# Patient Record
Sex: Female | Born: 1965 | Race: White | Hispanic: No | Marital: Married | Smoking: Current every day smoker
Health system: Southern US, Community
[De-identification: ages and names within clinical notes are randomized; demographics above are authoritative.]

## PROBLEM LIST (undated history)

## (undated) DIAGNOSIS — K219 Gastro-esophageal reflux disease without esophagitis: Secondary | ICD-10-CM

## (undated) DIAGNOSIS — F419 Anxiety disorder, unspecified: Secondary | ICD-10-CM

## (undated) DIAGNOSIS — M1990 Unspecified osteoarthritis, unspecified site: Secondary | ICD-10-CM

## (undated) DIAGNOSIS — I10 Essential (primary) hypertension: Secondary | ICD-10-CM

## (undated) HISTORY — PX: COLONOSCOPY: SHX174

---

## 2012-04-04 ENCOUNTER — Encounter (HOSPITAL_COMMUNITY): Payer: Self-pay | Admitting: Adult Health

## 2012-04-04 ENCOUNTER — Emergency Department (HOSPITAL_COMMUNITY)
Admission: EM | Admit: 2012-04-04 | Discharge: 2012-04-04 | Disposition: A | Discharge status: Home / Self Care | Payer: Managed Care, Other (non HMO) | Attending: Emergency Medicine | Admitting: Emergency Medicine

## 2012-04-04 ENCOUNTER — Emergency Department (HOSPITAL_COMMUNITY): Payer: Managed Care, Other (non HMO)

## 2012-04-04 DIAGNOSIS — R110 Nausea: Secondary | ICD-10-CM | POA: Insufficient documentation

## 2012-04-04 DIAGNOSIS — R12 Heartburn: Secondary | ICD-10-CM | POA: Insufficient documentation

## 2012-04-04 DIAGNOSIS — Z79899 Other long term (current) drug therapy: Secondary | ICD-10-CM | POA: Insufficient documentation

## 2012-04-04 DIAGNOSIS — R61 Generalized hyperhidrosis: Secondary | ICD-10-CM | POA: Insufficient documentation

## 2012-04-04 DIAGNOSIS — R1013 Epigastric pain: Secondary | ICD-10-CM | POA: Insufficient documentation

## 2012-04-04 DIAGNOSIS — K2900 Acute gastritis without bleeding: Secondary | ICD-10-CM | POA: Insufficient documentation

## 2012-04-04 DIAGNOSIS — F17200 Nicotine dependence, unspecified, uncomplicated: Secondary | ICD-10-CM | POA: Insufficient documentation

## 2012-04-04 DIAGNOSIS — K2970 Gastritis, unspecified, without bleeding: Secondary | ICD-10-CM

## 2012-04-04 DIAGNOSIS — I10 Essential (primary) hypertension: Secondary | ICD-10-CM | POA: Insufficient documentation

## 2012-04-04 HISTORY — DX: Essential (primary) hypertension: I10

## 2012-04-04 LAB — CBC
Hemoglobin: 16.1 g/dL — ABNORMAL HIGH (ref 12.0–15.0)
MCV: 91 fL (ref 78.0–100.0)
Platelets: 249 10*3/uL (ref 150–400)
RBC: 5.23 MIL/uL — ABNORMAL HIGH (ref 3.87–5.11)
WBC: 18.3 10*3/uL — ABNORMAL HIGH (ref 4.0–10.5)

## 2012-04-04 LAB — HEPATIC FUNCTION PANEL: Bilirubin, Direct: <0.1 mg/dL (ref 0.0–0.3)

## 2012-04-04 LAB — BASIC METABOLIC PANEL
CO2: 23 mEq/L (ref 19–32)
Chloride: 101 mEq/L (ref 96–112)
Creatinine, Ser: 0.79 mg/dL (ref 0.50–1.10)
Sodium: 137 mEq/L (ref 135–145)

## 2012-04-04 LAB — LIPASE, BLOOD: Lipase: 49 U/L (ref 11–59)

## 2012-04-04 LAB — D-DIMER, QUANTITATIVE: D-Dimer, Quant: <0.27 ug/mL-FEU (ref 0.00–0.48)

## 2012-04-04 LAB — POCT I-STAT TROPONIN I: Troponin i, poc: 0 ng/mL (ref 0.00–0.08)

## 2012-04-04 LAB — TROPONIN I: Troponin I: <0.3 ng/mL (ref <0.30)

## 2012-04-04 MED ORDER — OMEPRAZOLE 20 MG PO CPDR: 20.0000 mg | DELAYED_RELEASE_CAPSULE | Freq: Every day | ORAL | Status: DC

## 2012-04-04 MED ORDER — FAMOTIDINE 20 MG PO TABS
20.0000 mg | ORAL_TABLET | Freq: Once | ORAL | Status: AC
  Administered 2012-04-04: 20 mg via ORAL
  Filled 2012-04-04: qty 1

## 2012-04-04 MED ORDER — ONDANSETRON 4 MG PO TBDP
8.0000 mg | ORAL_TABLET | Freq: Once | ORAL | Status: AC
  Administered 2012-04-04: 8 mg via ORAL
  Filled 2012-04-04: qty 2

## 2012-04-04 MED ORDER — MORPHINE SULFATE 4 MG/ML IJ SOLN
4.0000 mg | Freq: Once | INTRAMUSCULAR | Status: AC
  Administered 2012-04-04: 4 mg via INTRAMUSCULAR
  Filled 2012-04-04: qty 1

## 2012-04-04 MED ORDER — SUCRALFATE 1 G PO TABS: 1.0000 g | ORAL_TABLET | Freq: Four times a day (QID) | ORAL | Status: DC

## 2012-04-04 MED ORDER — GI COCKTAIL ~~LOC~~
30.0000 mL | Freq: Once | ORAL | Status: AC
  Administered 2012-04-04: 30 mL via ORAL
  Filled 2012-04-04: qty 30

## 2012-04-04 MED ORDER — PANTOPRAZOLE SODIUM 40 MG IV SOLR
40.0000 mg | Freq: Once | INTRAVENOUS | Status: DC
  Filled 2012-04-04: qty 40

## 2012-04-04 NOTE — ED Notes (Addendum)
Onset of Epigastric/sternal chest pain described as sharp that began 04/04/11 in the AM, pain radiatted down into abdomen and into back. Pain is associated with nausea, "heartburn" and diaphoresis. Denies SOB, dizziness and vomiting. Pain is intermittent in  Midback. The epigastric pain is constant.

## 2012-04-04 NOTE — ED Notes (Signed)
Pt transported to US

## 2012-04-04 NOTE — ED Provider Notes (Signed)
History  This chart was scribed for Glynn Octave, MD by Bennett Scrape, ED Scribe. This patient was seen in room A06C/A06C and the patient's care was started at 2:24 AM.  CSN: [id number]  Arrival date & time 04/04/12  0130   First MD Initiated Contact with Patient 04/04/12 0224      Chief Complaint  Patient presents with  . Chest Pain     The history is provided by the patient. No language interpreter was used.    Chloe Warren is a 47 y.o. female who presents to the Emergency Department complaining of approximately 16 hours of gradual onset, non-changing, constant sternal CP described as sharp that radiates into the epigastric region and occasionally into the lower back with associated nausea and diaphoresis. She reports having similar intermittent pain that resolved on its own but nothing constant. She denies having any modifying factors and reports taking an Aleve with improvement.She reports having a prior GERD diagnosis with symptoms that would improve with milk but denies similarities. She denies having a h/o cardiac or abdominal problems. She denies having prior stress tests performed. She denies rash, palpitations, diarrhea, fevers, chills and dizziness as associated symptoms. She has a h/o HTN and is a current everyday smoker and occasional alcohol user.   PCP is with [facility]  Past Medical History  Diagnosis Date  . Hypertension     History reviewed. No pertinent past surgical history.  No family history on file.  History  Substance Use Topics  . Smoking status: Current Every Day Smoker  . Smokeless tobacco: Not on file  . Alcohol Use: Yes    No OB history provided.  Review of Systems  A complete 10 system review of systems was obtained and all systems are negative except as noted in the HPI and PMH.   Allergies  Review of patient's allergies indicates no known allergies.  Home Medications   Current Outpatient Rx  Name  Route  Sig  Dispense   Refill  . METOPROLOL TARTRATE 50 MG PO TABS   Oral   Take 50 mg by mouth every evening.         . ADULT MULTIVITAMIN W/MINERALS CH   Oral   Take 1 tablet by mouth daily.         Marland Kitchen NAPROXEN SODIUM 220 MG PO TABS   Oral   Take 220 mg by mouth 2 (two) times daily as needed. For pain           Triage Vitals: BP 160/97  Pulse 71  Temp 98.1 F (36.7 C) (Oral)  Resp 16  SpO2 96%  Physical Exam  Nursing note and vitals reviewed. Constitutional: She is oriented to person, place, and time. She appears well-developed and well-nourished. No distress.  HENT:  Head: Normocephalic and atraumatic.  Mouth/Throat: Oropharynx is clear and moist.  Eyes: Conjunctivae normal and EOM are normal. Pupils are equal, round, and reactive to light.  Neck: Normal range of motion. Neck supple. No tracheal deviation present.  Cardiovascular: Normal rate and regular rhythm.   Pulmonary/Chest: Effort normal and breath sounds normal. No respiratory distress. She exhibits no tenderness (chest pain is non-reproducible to palpation).  Abdominal: Soft. Bowel sounds are normal. There is tenderness (mild epigastric tenderness to palpation). There is no rebound and no guarding.  Musculoskeletal: Normal range of motion. She exhibits no edema.  Neurological: She is alert and oriented to person, place, and time. No cranial nerve deficit.       Strength  is 5/5 throughout  Skin: Skin is warm and dry.  Psychiatric: She has a normal mood and affect. Her behavior is normal.    ED Course  Procedures (including critical care time)  DIAGNOSTIC STUDIES: Oxygen Saturation is 96% on room air, adeqaute by my interpretation.    COORDINATION OF CARE: 2:48 AM- Informed pt of negative heart tracings. Discussed treatment plan which includes CXR, GI cocktail, d-dimer and CBC panel with pt at bedside and pt agreed to plan.   3:00 AM- Ordered GI cocktail and 40 mg Protonix injection  3:30 AM- Ordered Pepcid 20 mg  tablet  3:36 AM-Pt rechecked and still complains of pain. Will recheck after pt is given Pepcid  5:10 AM- Pt rechecked and reports abdominal pain only. Non-tender to palpation upon re-exam. Informed pt of lab and radiology results. Discussed discharge plan of Pepcid, antiemetic and follow up with PCP with pt and pt agreed. Also advised pt to take tylenol for pain due to Aleve and ibuprofen possibly leading to ulcers and pt agreed. Pt is also requesting a work note which I will provide.  5:15 AM-Ordered 8 mg Zofran tablet  Labs Reviewed  CBC - Abnormal; Notable for the following:    WBC 18.3 (*)     RBC 5.23 (*)     Hemoglobin 16.1 (*)     HCT 47.6 (*)     All other components within normal limits  BASIC METABOLIC PANEL - Abnormal; Notable for the following:    Glucose, Bld 109 (*)     All other components within normal limits  LIPASE, BLOOD  POCT I-STAT TROPONIN I  D-DIMER, QUANTITATIVE   Dg Chest 2 View  04/04/2012  *RADIOLOGY REPORT*  Clinical Data: Mid chest pain.  CHEST - 2 VIEW  Comparison: None.  Findings: Lungs are clear.  Heart size is normal.  No pneumothorax or pleural fluid.  IMPRESSION: No acute disease.   Original Report Authenticated By: Holley Dexter, M.D.      No diagnosis found.    MDM  Constant substernal chest pain since 11 AM yesterday that radiates to the epigastrium. Associated with nausea. No shortness of breath, vomiting, diaphoresis cough or fever. No cardiac history.    EKG nonischemic. Chest x-ray negative. Leukocytosis noted. LFTs and lipase normal.  D-dimer negative. Delta troponin negative. Patient's pain is atypical for ACS as it has been ongoing for 16 hours. Some relief with GI cocktail. We'll treat with PPI and PCP followup.  [place] of RUQ pending at time of sign out to Dr. Effie Shy.   Date: 04/04/2012  Rate: 75  Rhythm: normal sinus rhythm  QRS Axis: normal  Intervals: normal  ST/T Wave abnormalities: normal  Conduction Disutrbances:none   Narrative Interpretation:   Old EKG Reviewed: none available     I personally performed the services described in this documentation, which was scribed in my presence. The recorded information has been reviewed and is accurate.    Glynn Octave, MD 04/04/12 [id number]

## 2013-09-18 IMAGING — CR DG CHEST 2V
2 series · 2 of 2 positions shown · non-contrast
Comparison: None.

CLINICAL DATA: Mid chest pain.

CHEST - 2 VIEW

[w chest pa]
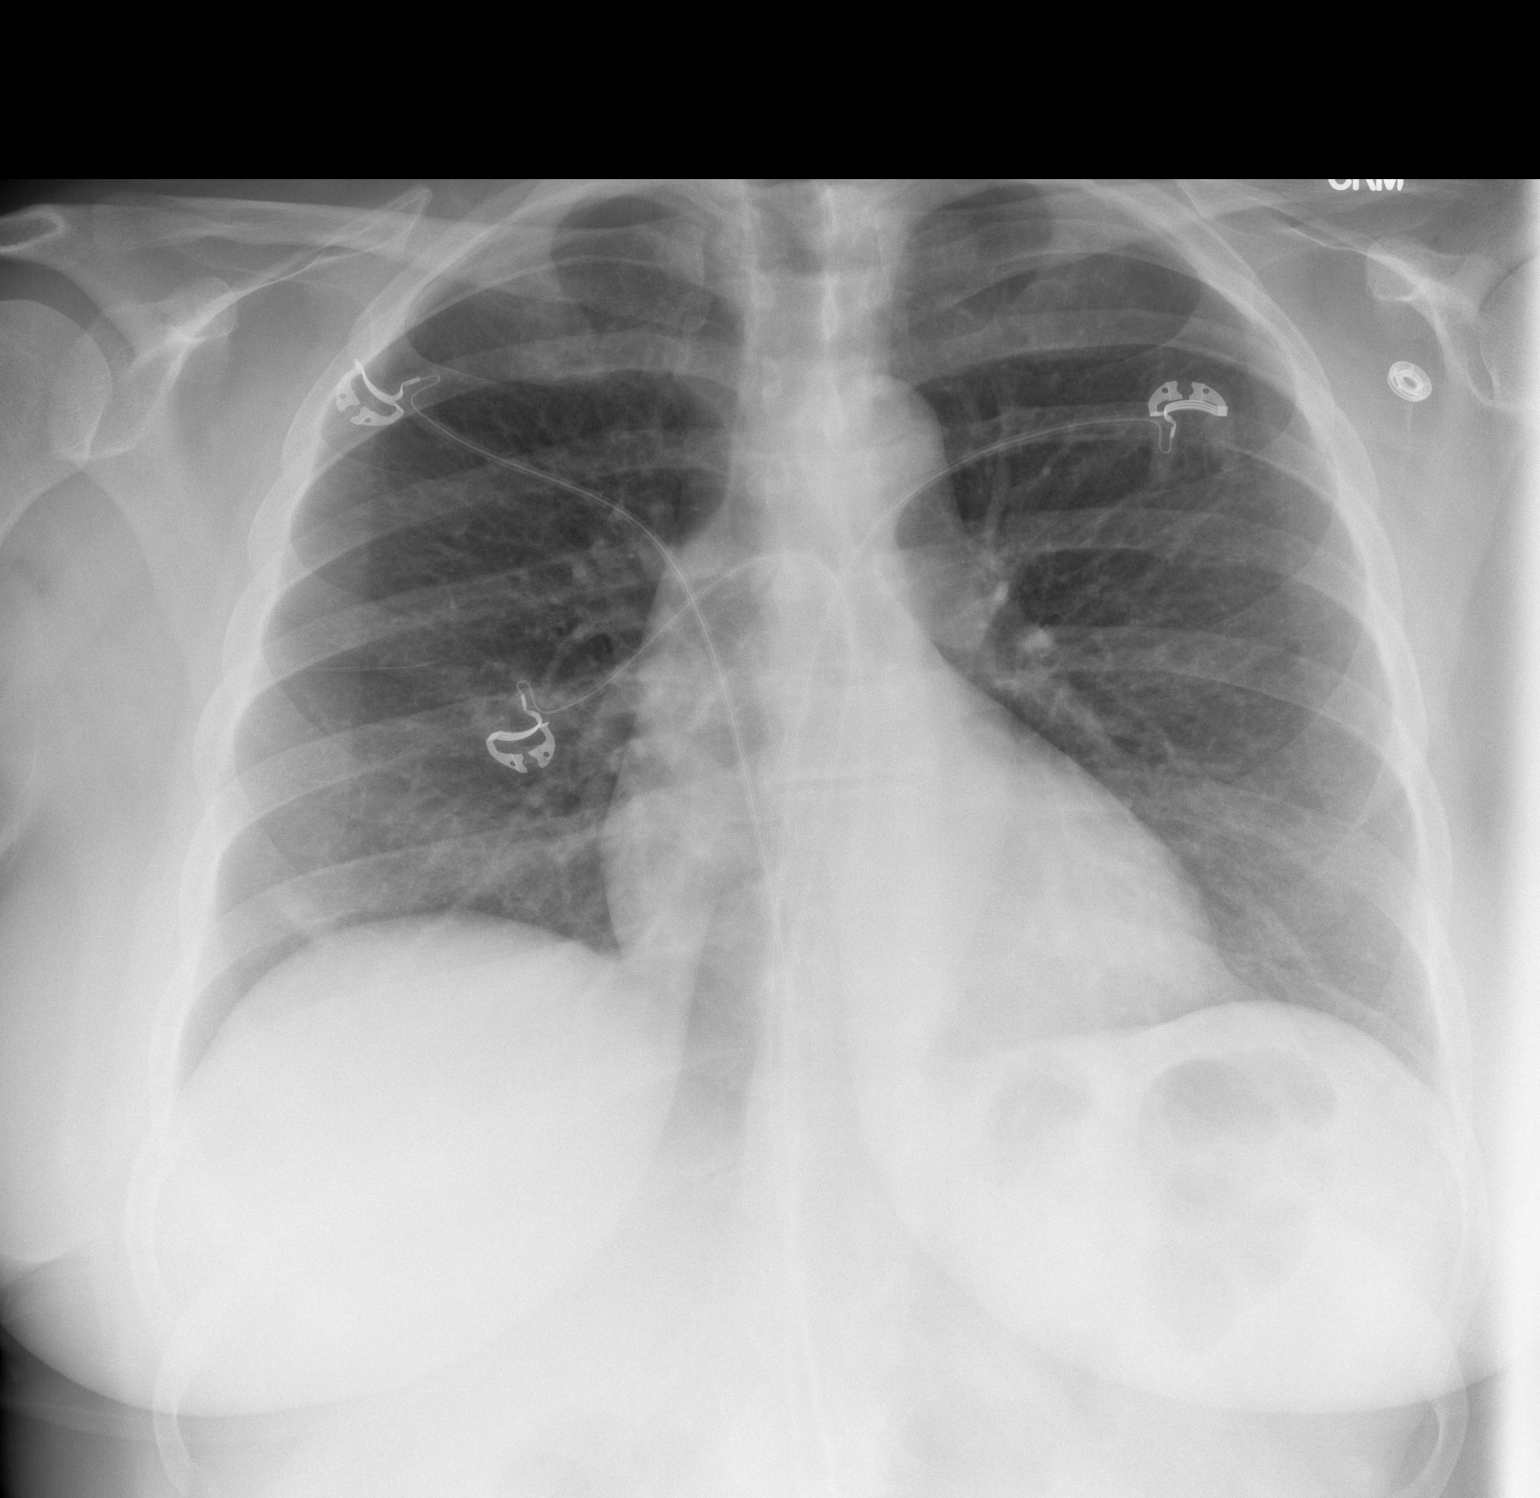

[w chest lat]
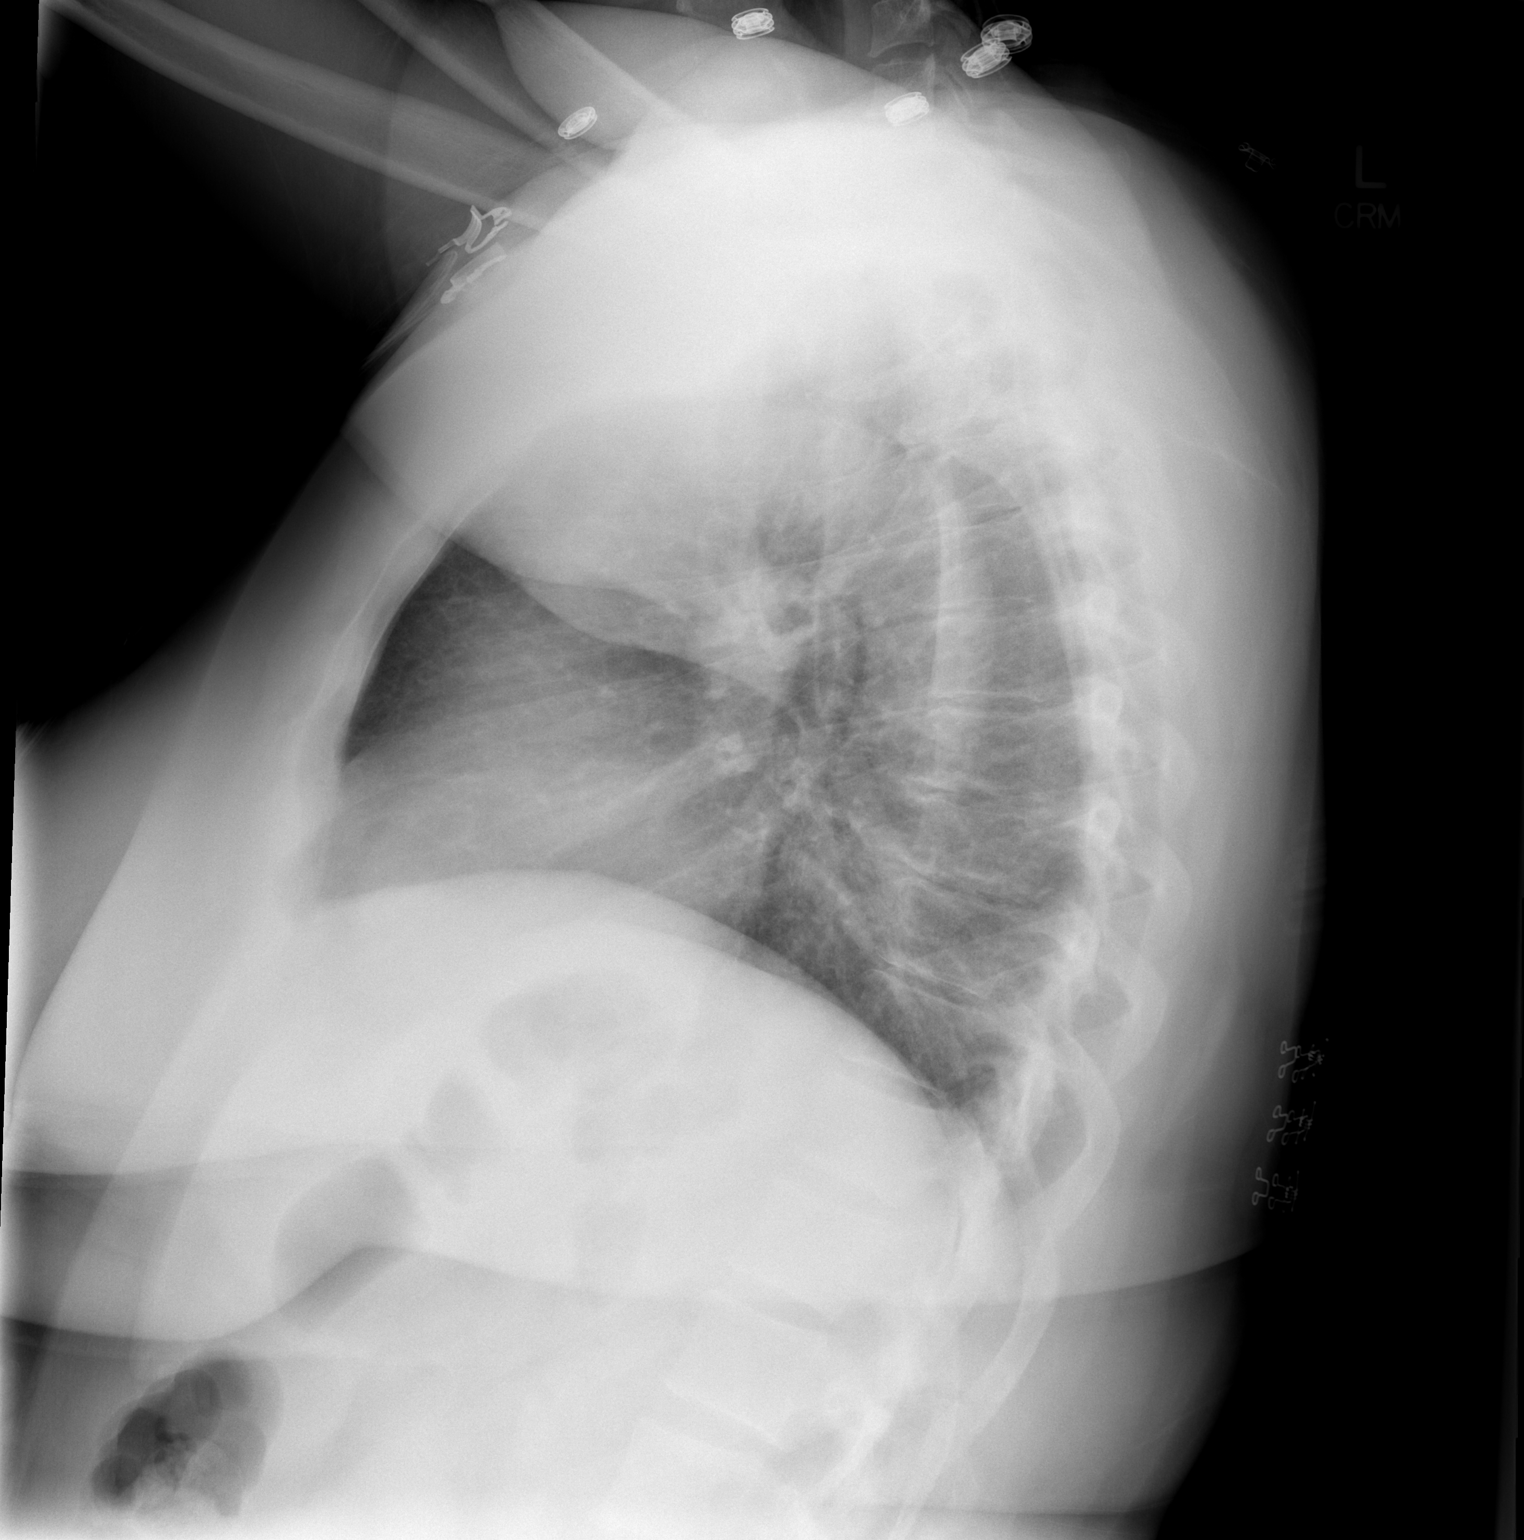

[2 of 2 positions shown; findings below may reference images not displayed]

FINDINGS: Lungs are clear.  Heart size is normal.  No pneumothorax
or pleural fluid.
IMPRESSION: No acute disease.

## 2013-09-18 IMAGING — US US ABDOMEN COMPLETE
1 series · 14 of 25 positions shown · non-contrast
Comparison: None.

CLINICAL DATA: Right upper quadrant pain.  Leukocytosis.

COMPLETE ABDOMINAL ULTRASOUND

[Series 1: us abdomen complete · 0.31mm/px · 14 of 78 slices shown]
[im 1/78]
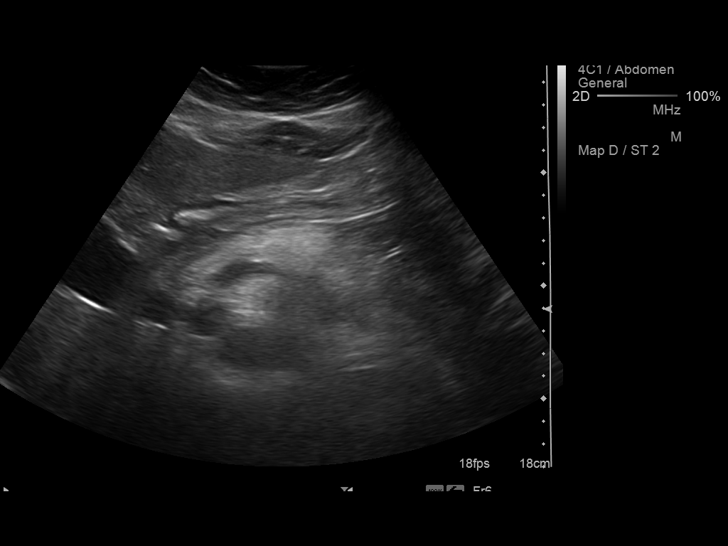
[im 7/78]
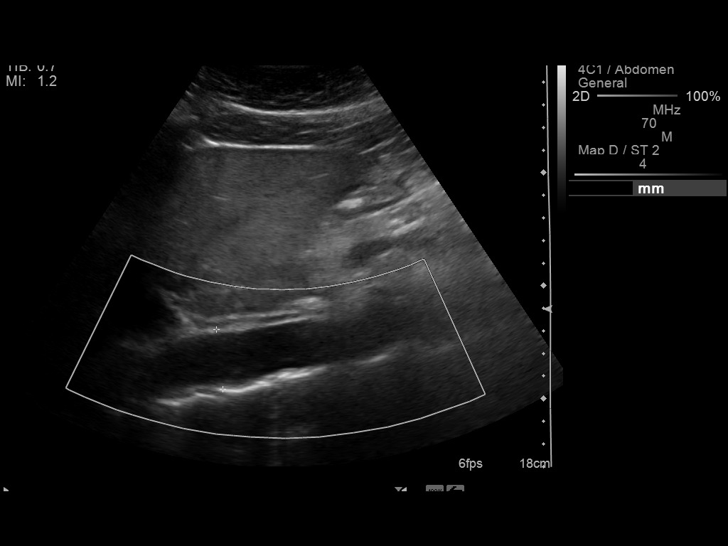
[im 13/78]
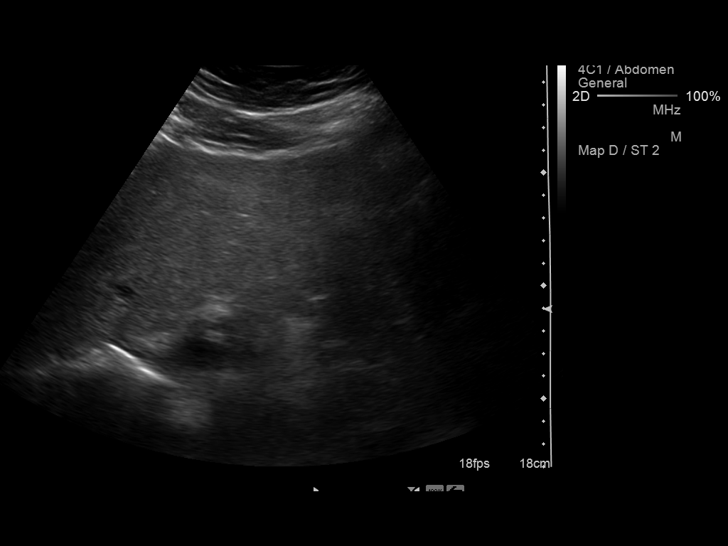
[im 20/78]
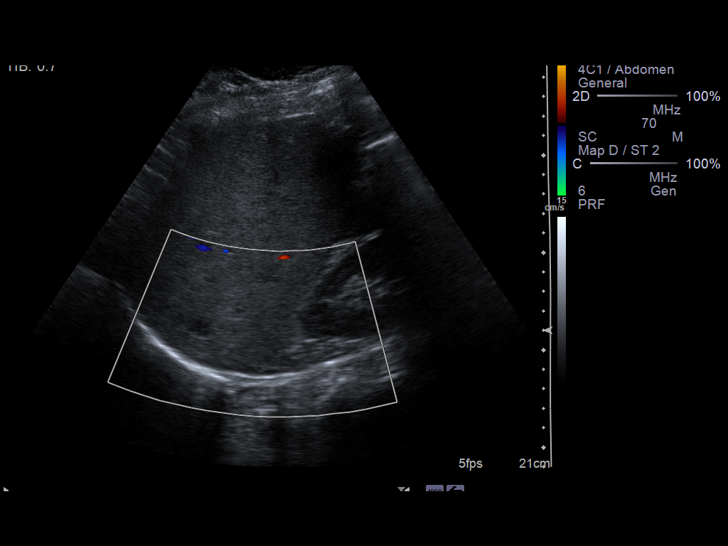
[im 26/78]
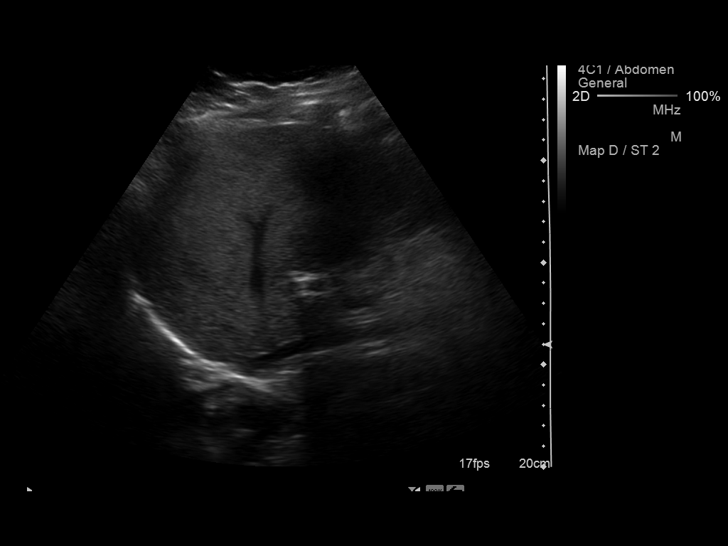
[im 29/78]
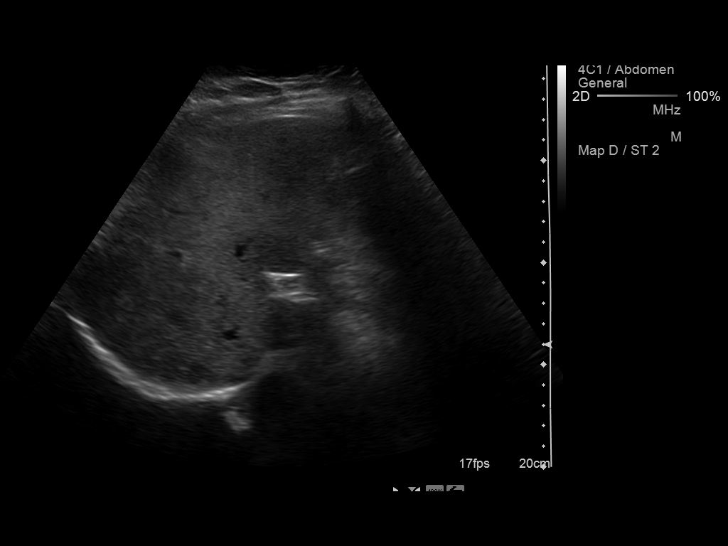
[im 36/78]
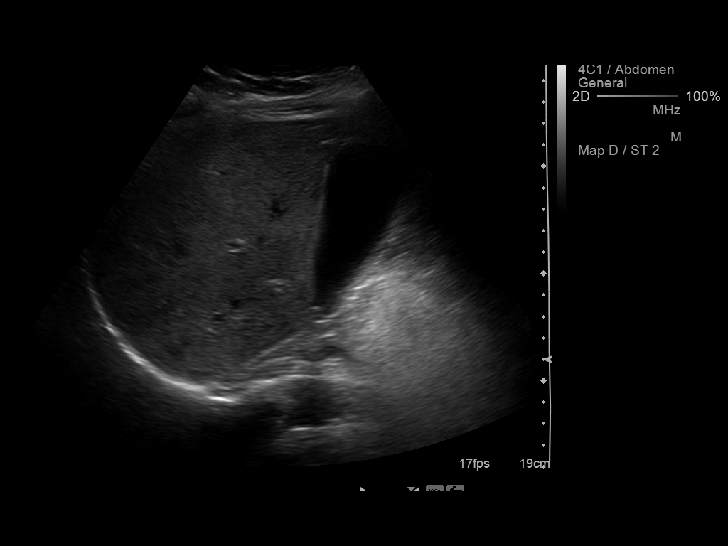
[im 42/78]
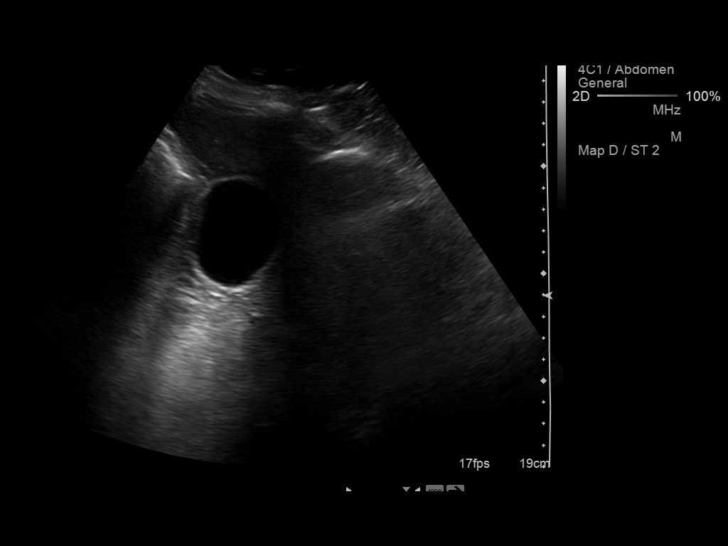
[im 49/78]
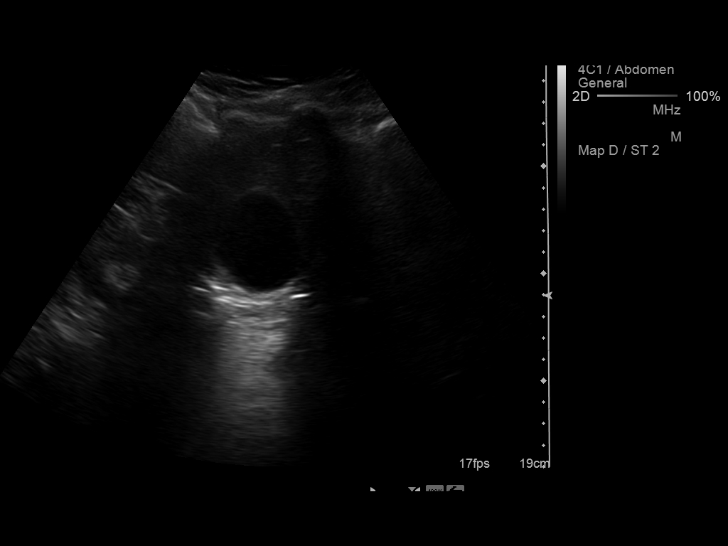
[im 52/78]
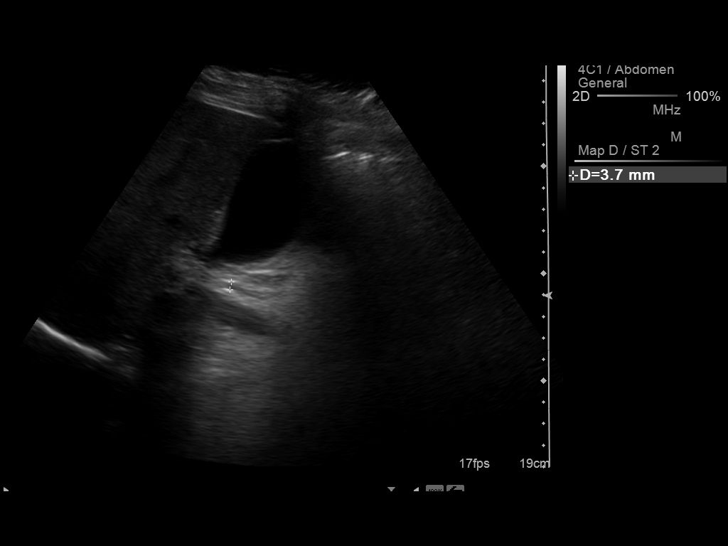
[im 58/78]
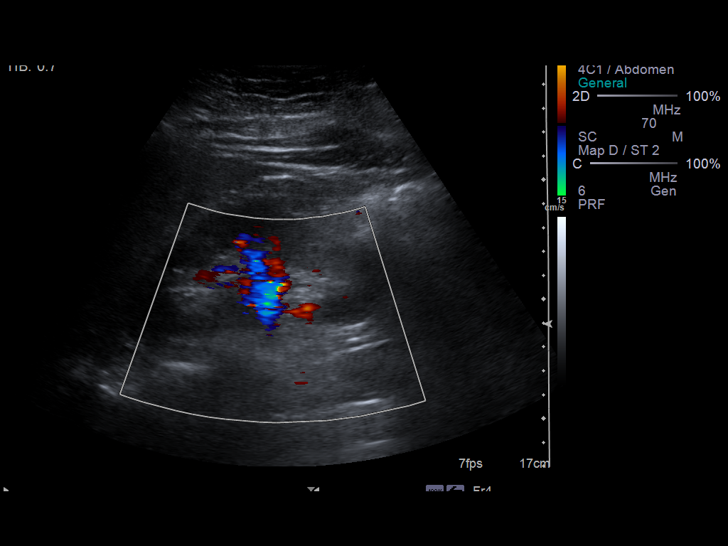
[im 65/78]
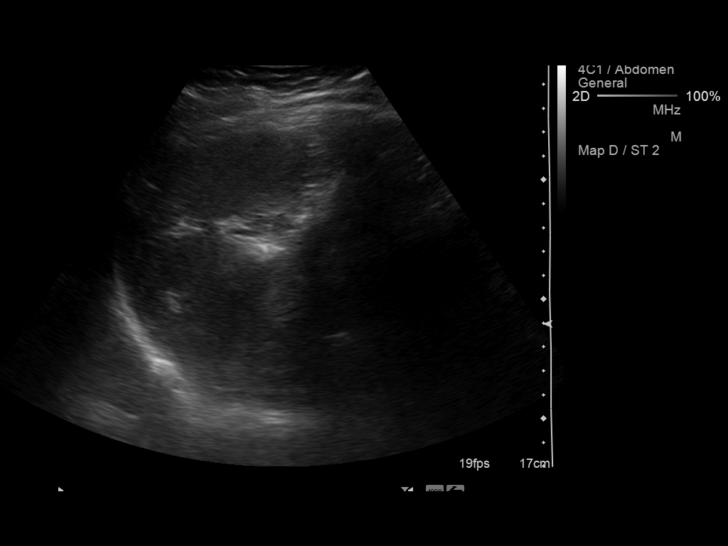
[im 71/78]
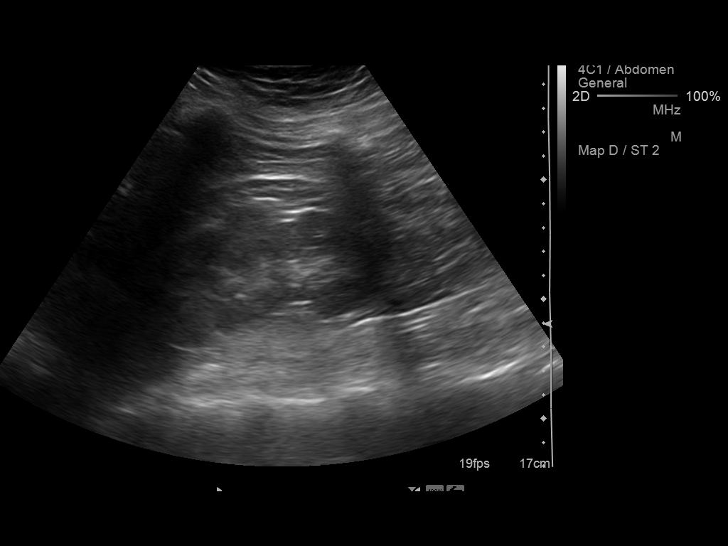
[im 78/78]
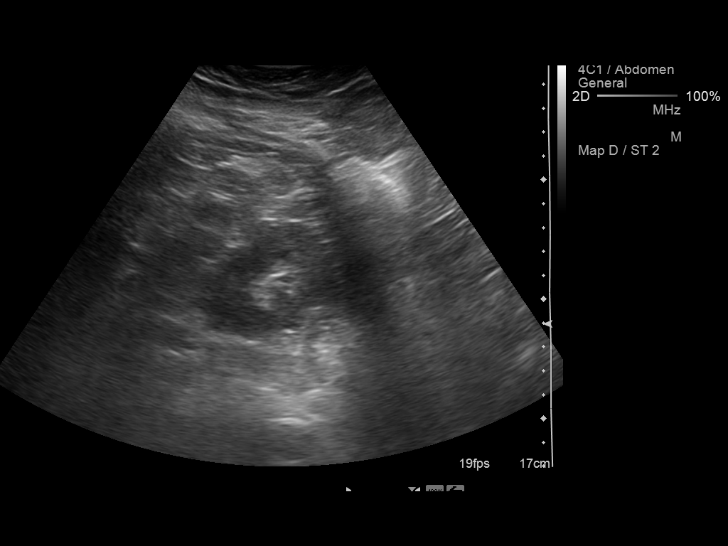

[14 of 25 positions shown; findings below may reference images not displayed]

FINDINGS: Gallbladder:  Normal without stones, sludge or wall thickening.

Common bile duct:  Normal at 3.7 mm.

Liver:  Normal echogenicity.  1.3 cm cyst in the posterior segment
right lobe.  No other focal finding.  No ductal dilatation.

IVC:  Normal

Pancreas:  Normal appearance of the head and body.  Incomplete
visualization of the tail because of overlying bowel gas.

Spleen:  Normal at 8.3 cm.

Right Kidney:  Normal echogenicity.  11.7 cm in length.  No focal
lesions or hydronephrosis.

Left Kidney:  Similarly normal at 11.5 cm.

Abdominal aorta:  Maximal diameter 2.7 cm.

No ascites
IMPRESSION: No cause of the symptoms is identified.  Normal study except for a
1.3 cm cyst in the right lobe of the liver and incomplete
visualization of the tail of the pancreas because of overlying
bowel gas.

## 2018-12-17 ENCOUNTER — Other Ambulatory Visit: Payer: Self-pay

## 2018-12-17 ENCOUNTER — Emergency Department (HOSPITAL_COMMUNITY)
Admission: EM | Admit: 2018-12-17 | Discharge: 2018-12-17 | Disposition: A | Discharge status: Home / Self Care | Payer: BC Managed Care – PPO | Attending: Emergency Medicine | Admitting: Emergency Medicine

## 2018-12-17 ENCOUNTER — Encounter (HOSPITAL_COMMUNITY): Payer: Self-pay | Admitting: Emergency Medicine

## 2018-12-17 DIAGNOSIS — Z5321 Procedure and treatment not carried out due to patient leaving prior to being seen by health care provider: Secondary | ICD-10-CM | POA: Insufficient documentation

## 2018-12-17 DIAGNOSIS — R1013 Epigastric pain: Secondary | ICD-10-CM | POA: Insufficient documentation

## 2018-12-17 HISTORY — DX: Gastro-esophageal reflux disease without esophagitis: K21.9

## 2018-12-17 LAB — COMPREHENSIVE METABOLIC PANEL
ALT: 22 U/L (ref 0–44)
AST: 17 U/L (ref 15–41)
Albumin: 4 g/dL (ref 3.5–5.0)
Alkaline Phosphatase: 88 U/L (ref 38–126)
Anion gap: 11 (ref 5–15)
BUN: 11 mg/dL (ref 6–20)
CO2: 29 mmol/L (ref 22–32)
Calcium: 10.4 mg/dL — ABNORMAL HIGH (ref 8.9–10.3)
Chloride: 98 mmol/L (ref 98–111)
Creatinine, Ser: 0.97 mg/dL (ref 0.44–1.00)
GFR calc Af Amer: >60 mL/min (ref >60)
GFR calc non Af Amer: >60 mL/min (ref >60)
Glucose, Bld: 113 mg/dL — ABNORMAL HIGH (ref 70–99)
Potassium: 4.3 mmol/L (ref 3.5–5.1)
Sodium: 138 mmol/L (ref 135–145)
Total Bilirubin: 0.5 mg/dL (ref 0.3–1.2)
Total Protein: 7.8 g/dL (ref 6.5–8.1)

## 2018-12-17 LAB — URINALYSIS, ROUTINE W REFLEX MICROSCOPIC
Bilirubin Urine: NEGATIVE
Glucose, UA: NEGATIVE mg/dL
Ketones, ur: NEGATIVE mg/dL
Nitrite: NEGATIVE
Protein, ur: NEGATIVE mg/dL
Specific Gravity, Urine: 1.02 (ref 1.005–1.030)
pH: 5 (ref 5.0–8.0)

## 2018-12-17 LAB — CBC
HCT: 49.7 % — ABNORMAL HIGH (ref 36.0–46.0)
Hemoglobin: 16.7 g/dL — ABNORMAL HIGH (ref 12.0–15.0)
MCH: 31.4 pg (ref 26.0–34.0)
MCHC: 33.6 g/dL (ref 30.0–36.0)
MCV: 93.4 fL (ref 80.0–100.0)
Platelets: 311 10*3/uL (ref 150–400)
RBC: 5.32 MIL/uL — ABNORMAL HIGH (ref 3.87–5.11)
RDW: 13.2 % (ref 11.5–15.5)
WBC: 17.4 10*3/uL — ABNORMAL HIGH (ref 4.0–10.5)
nRBC: 0 % (ref 0.0–0.2)

## 2018-12-17 LAB — LIPASE, BLOOD: Lipase: 53 U/L — ABNORMAL HIGH (ref 11–51)

## 2018-12-17 MED ORDER — SODIUM CHLORIDE 0.9% FLUSH: 3.0000 mL | Freq: Once | INTRAVENOUS | Status: DC

## 2018-12-17 NOTE — ED Notes (Signed)
No answer from pt in wainting room

## 2018-12-17 NOTE — ED Triage Notes (Signed)
Pt to ED with c/o epigastric pain onset 3 days ago.  St's yesterday pain started radiating into back.  Nausea without vomiting

## 2018-12-17 NOTE — ED Notes (Signed)
Called patient twice for vital check no answer

## 2020-03-30 HISTORY — PX: OTHER SURGICAL HISTORY: SHX169

## 2021-06-04 DIAGNOSIS — Z01818 Encounter for other preprocedural examination: Secondary | ICD-10-CM

## 2022-04-16 ENCOUNTER — Institutional Professional Consult (permissible substitution): Payer: Self-pay | Admitting: Student in an Organized Health Care Education/Training Program

## 2023-12-27 ENCOUNTER — Other Ambulatory Visit (HOSPITAL_COMMUNITY): Payer: Self-pay | Admitting: General Surgery

## 2023-12-27 DIAGNOSIS — E213 Hyperparathyroidism, unspecified: Secondary | ICD-10-CM

## 2023-12-30 ENCOUNTER — Encounter (HOSPITAL_COMMUNITY)
Admission: RE | Admit: 2023-12-30 | Discharge: 2023-12-30 | Disposition: A | Discharge status: Home / Self Care | Payer: Self-pay | Source: Ambulatory Visit | Attending: General Surgery | Admitting: General Surgery

## 2023-12-30 ENCOUNTER — Encounter (HOSPITAL_COMMUNITY): Payer: Self-pay

## 2023-12-30 DIAGNOSIS — E213 Hyperparathyroidism, unspecified: Secondary | ICD-10-CM | POA: Diagnosis present

## 2023-12-30 MED ORDER — TECHNETIUM TC 99M SESTAMIBI - CARDIOLITE
23.8000 | Freq: Once | INTRAVENOUS | Status: AC
  Administered 2023-12-30: 23.8 via INTRAVENOUS

## 2024-01-01 ENCOUNTER — Ambulatory Visit (HOSPITAL_BASED_OUTPATIENT_CLINIC_OR_DEPARTMENT_OTHER)
Admission: RE | Admit: 2024-01-01 | Discharge: 2024-01-01 | Disposition: A | Discharge status: Home / Self Care | Source: Ambulatory Visit | Attending: General Surgery | Admitting: General Surgery

## 2024-01-01 DIAGNOSIS — E213 Hyperparathyroidism, unspecified: Secondary | ICD-10-CM | POA: Diagnosis present

## 2024-01-01 DIAGNOSIS — R937 Abnormal findings on diagnostic imaging of other parts of musculoskeletal system: Secondary | ICD-10-CM | POA: Diagnosis not present

## 2024-01-01 LAB — POCT I-STAT CREATININE: Creatinine, Ser: 0.9 mg/dL (ref 0.44–1.00)

## 2024-01-01 MED ORDER — IOHEXOL 300 MG/ML  SOLN
75.0000 mL | Freq: Once | INTRAMUSCULAR | Status: AC | PRN
  Administered 2024-01-01: 75 mL via INTRAVENOUS

## 2024-02-16 NOTE — Progress Notes (Signed)
 Sent message, via epic in basket, requesting orders in epic from Careers adviser.

## 2024-02-22 NOTE — Patient Instructions (Signed)
 SURGICAL WAITING ROOM VISITATION  Patients having surgery or a procedure may have no more than 2 support people in the waiting area - these visitors may rotate.    Children under the age of 22 must have an adult with them who is not the patient.  Visitors with respiratory illnesses are discouraged from visiting and should remain at home.  If the patient needs to stay at the hospital during part of their recovery, the visitor guidelines for inpatient rooms apply. Pre-op nurse will coordinate an appropriate time for 1 support person to accompany patient in pre-op.  This support person may not rotate.    Please refer to the [facility] website for the visitor guidelines for Inpatients (after your surgery is over and you are in a regular room).    Your procedure is scheduled on: 03/17/24   Report to [facility] Main Entrance    Report to admitting at 5:15 AM   Call this number if you have problems the morning of surgery [phone]   Do not eat food or drink liquids :After Midnight.          If you have questions, please contact your surgeon's office.   FOLLOW BOWEL PREP AND ANY ADDITIONAL PRE OP INSTRUCTIONS YOU RECEIVED FROM YOUR SURGEON'S OFFICE!!!     Oral Hygiene is also important to reduce your risk of infection.                                    Remember - BRUSH YOUR TEETH THE MORNING OF SURGERY WITH YOUR REGULAR TOOTHPASTE  DENTURES WILL BE REMOVED PRIOR TO SURGERY PLEASE DO NOT APPLY Poly grip OR ADHESIVES!!!   Do NOT smoke after Midnight   Stop all vitamins and herbal supplements 7 days before surgery.   Take these medicines the morning of surgery with A SIP OF WATER: Omeprazole , Rosuvastatin              You may not have any metal on your body including hair pins, jewelry, and body piercing             Do not wear make-up, lotions, powders, perfumes, or deodorant  Do not wear nail polish including gel and S&S, artificial/acrylic nails, or any other type  of covering on natural nails including finger and toenails. If you have artificial nails, gel coating, etc. that needs to be removed by a nail salon please have this removed prior to surgery or surgery may need to be canceled/ delayed if the surgeon/ anesthesia feels like they are unable to be safely monitored.   Do not shave  48 hours prior to surgery.    Do not bring valuables to the hospital. [facility] IS NOT             RESPONSIBLE   FOR VALUABLES.   Contacts, glasses, dentures or bridgework may not be worn into surgery.  DO NOT BRING YOUR HOME MEDICATIONS TO THE HOSPITAL. PHARMACY WILL DISPENSE MEDICATIONS LISTED ON YOUR MEDICATION LIST TO YOU DURING YOUR ADMISSION IN THE HOSPITAL!    Patients discharged on the day of surgery will not be allowed to drive home.  Someone NEEDS to stay with you for the first 24 hours after anesthesia.              Please read over the following fact sheets you were given: IF YOU HAVE QUESTIONS ABOUT YOUR PRE-OP INSTRUCTIONS  PLEASE CALL [phone].   If you received a COVID test during your pre-op visit  it is requested that you wear a mask when out in public, stay away from anyone that may not be feeling well and notify your surgeon if you develop symptoms. If you test positive for Covid or have been in contact with anyone that has tested positive in the last 10 days please notify you surgeon.    [facility] - Preparing for Surgery Before surgery, you can play an important role.  Because skin is not sterile, your skin needs to be as free of germs as possible.  You can reduce the number of germs on your skin by washing with CHG (chlorahexidine gluconate) soap before surgery.  CHG is an antiseptic cleaner which kills germs and bonds with the skin to continue killing germs even after washing. Please DO NOT use if you have an allergy to CHG or antibacterial soaps.  If your skin becomes reddened/irritated stop using the CHG and inform your nurse when  you arrive at Short Stay. Do not shave (including legs and underarms) for at least 48 hours prior to the first CHG shower.  You may shave your face/neck.  Please follow these instructions carefully:  1.  Shower with CHG Soap the night before surgery ONLY (DO NOT USE THE SOAP THE MORNING OF SURGERY).  2.  If you choose to wash your hair, wash your hair first as usual with your normal  shampoo.  3.  After you shampoo, rinse your hair and body thoroughly to remove the shampoo.                             4.  Use CHG as you would any other liquid soap.  You can apply chg directly to the skin and wash.  Gently with a scrungie or clean washcloth.  5.  Apply the CHG Soap to your body ONLY FROM THE NECK DOWN.   Do   not use on face/ open                           Wound or open sores. Avoid contact with eyes, ears mouth and   genitals (private parts).                       Wash face,  Genitals (private parts) with your normal soap.             6.  Wash thoroughly, paying special attention to the area where your    surgery  will be performed.  7.  Thoroughly rinse your body with warm water from the neck down.  8.  DO NOT shower/wash with your normal soap after using and rinsing off the CHG Soap.                9.  Pat yourself dry with a clean towel.            10.  Wear clean pajamas.            11.  Place clean sheets on your bed the night of your first shower and do not  sleep with pets. Day of Surgery : Do not apply any CHG, lotions/deodorants the morning of surgery.  Please wear clean clothes to the hospital/surgery center.  FAILURE TO FOLLOW THESE INSTRUCTIONS MAY RESULT IN THE CANCELLATION OF YOUR  SURGERY  PATIENT SIGNATURE_________________________________  NURSE SIGNATURE__________________________________  ________________________________________________________________________

## 2024-02-22 NOTE — Progress Notes (Signed)
 Date of COVID positive in last 90 days:  PCP - [facility]  Cardiologist - n/a  Chest x-ray - N/A EKG - 02/23/24 Epic/chart Stress Test - N/A ECHO - N/A Cardiac Cath - N/A Pacemaker/ICD device last checked:N/A Spinal Cord Stimulator:N/A  Bowel Prep - N/A  Sleep Study - N/A CPAP -   Fasting Blood Sugar - N/A Checks Blood Sugar _____ times a day  Last dose of GLP1 agonist-  N/A GLP1 instructions:  Do not take after     Last dose of SGLT-2 inhibitors-  N/A SGLT-2 instructions:  Do not take after     Blood Thinner Instructions: N/A Last dose:   Time: Aspirin Instructions: ASA 81, hold 5-7 days Last Dose:  Activity level: Can go up a flight of stairs and perform activities of daily living without stopping and without symptoms of chest pain or shortness of breath.   Anesthesia review: BP 151/107 and 151/104, patient denies symptoms and states she has not taken her BP pills last 2 days. Will obtain EKG. Instructed her to make sure she takes her BP meds and monitor over the weekend. Call PCP if it is still elevated on Monday  Patient denies shortness of breath, fever, cough and chest pain at PAT appointment  Patient verbalized understanding of instructions that were given to them at the PAT appointment. Patient was also instructed that they will need to review over the PAT instructions again at home before surgery.

## 2024-02-22 NOTE — Progress Notes (Signed)
 Place orders for PAT appointment scheduled 02/23/24.

## 2024-02-23 ENCOUNTER — Other Ambulatory Visit: Payer: Self-pay

## 2024-02-23 ENCOUNTER — Encounter (HOSPITAL_COMMUNITY)
Admission: RE | Admit: 2024-02-23 | Discharge: 2024-02-23 | Disposition: A | Discharge status: Home / Self Care | Source: Ambulatory Visit | Attending: General Surgery | Admitting: General Surgery

## 2024-02-23 ENCOUNTER — Encounter (HOSPITAL_COMMUNITY): Payer: Self-pay

## 2024-02-23 VITALS — BP 151/104 | HR 70 | Temp 97.9°F | Resp 16 | Ht 64.0 in | Wt 242.0 lb

## 2024-02-23 DIAGNOSIS — I10 Essential (primary) hypertension: Secondary | ICD-10-CM | POA: Diagnosis not present

## 2024-02-23 DIAGNOSIS — Z01818 Encounter for other preprocedural examination: Secondary | ICD-10-CM | POA: Diagnosis present

## 2024-02-23 HISTORY — DX: Anxiety disorder, unspecified: F41.9

## 2024-02-23 HISTORY — DX: Unspecified osteoarthritis, unspecified site: M19.90

## 2024-02-23 LAB — BASIC METABOLIC PANEL WITH GFR
Anion gap: 10 (ref 5–15)
BUN: 16 mg/dL (ref 6–20)
CO2: 27 mmol/L (ref 22–32)
Calcium: 10.3 mg/dL (ref 8.9–10.3)
Chloride: 102 mmol/L (ref 98–111)
Creatinine, Ser: 0.8 mg/dL (ref 0.44–1.00)
GFR, Estimated: >60 mL/min (ref >60)
Glucose, Bld: 91 mg/dL (ref 70–99)
Potassium: 4.4 mmol/L (ref 3.5–5.1)
Sodium: 139 mmol/L (ref 135–145)

## 2024-02-23 LAB — CBC
HCT: 50.3 % — ABNORMAL HIGH (ref 36.0–46.0)
Hemoglobin: 16.4 g/dL — ABNORMAL HIGH (ref 12.0–15.0)
MCH: 29.5 pg (ref 26.0–34.0)
MCHC: 32.6 g/dL (ref 30.0–36.0)
MCV: 90.5 fL (ref 80.0–100.0)
Platelets: 287 K/uL (ref 150–400)
RBC: 5.56 MIL/uL — ABNORMAL HIGH (ref 3.87–5.11)
RDW: 14.5 % (ref 11.5–15.5)
WBC: 15 K/uL — ABNORMAL HIGH (ref 4.0–10.5)
nRBC: 0 % (ref 0.0–0.2)

## 2024-02-27 ENCOUNTER — Ambulatory Visit: Payer: Self-pay | Admitting: General Surgery

## 2024-03-16 NOTE — Anesthesia Preprocedure Evaluation (Signed)
 Anesthesia Evaluation    Reviewed: Allergy & Precautions, Patient's Chart, lab work & pertinent test results  Airway        Dental   Pulmonary neg pulmonary ROS, Current Smoker          Cardiovascular hypertension,      Neuro/Psych  PSYCHIATRIC DISORDERS Anxiety     negative neurological ROS     GI/Hepatic Neg liver ROS,GERD  ,,  Endo/Other  negative endocrine ROS    Renal/GU negative Renal ROS  negative genitourinary   Musculoskeletal  (+) Arthritis ,    Abdominal   Peds  Hematology negative hematology ROS (+)   Anesthesia Other Findings   Reproductive/Obstetrics                              Anesthesia Physical Anesthesia Plan  ASA: 2  Anesthesia Plan: General   Post-op Pain Management: Tylenol PO (pre-op)*   Induction: Intravenous  PONV Risk Score and Plan: 2 and Midazolam, Dexamethasone and Ondansetron   Airway Management Planned: Oral ETT  Additional Equipment:   Intra-op Plan:   Post-operative Plan: Extubation in OR  Informed Consent: I have reviewed the patients History and Physical, chart, labs and discussed the procedure including the risks, benefits and alternatives for the proposed anesthesia with the patient or authorized representative who has indicated his/her understanding and acceptance.     Dental advisory given  Plan Discussed with: CRNA  Anesthesia Plan Comments:         Anesthesia Quick Evaluation

## 2024-03-17 ENCOUNTER — Other Ambulatory Visit (HOSPITAL_COMMUNITY): Payer: Self-pay

## 2024-03-17 ENCOUNTER — Other Ambulatory Visit: Payer: Self-pay

## 2024-03-17 ENCOUNTER — Encounter (HOSPITAL_COMMUNITY): Payer: Self-pay | Admitting: General Surgery

## 2024-03-17 ENCOUNTER — Encounter (HOSPITAL_COMMUNITY): Admission: RE | Disposition: A | Payer: Self-pay | Source: Ambulatory Visit | Attending: General Surgery

## 2024-03-17 ENCOUNTER — Encounter (HOSPITAL_COMMUNITY): Payer: Self-pay | Admitting: Physician Assistant

## 2024-03-17 ENCOUNTER — Observation Stay (HOSPITAL_COMMUNITY)
Admission: RE | Admit: 2024-03-17 | Discharge: 2024-03-18 | Disposition: A | Discharge status: Home / Self Care | Source: Ambulatory Visit | Attending: General Surgery | Admitting: General Surgery

## 2024-03-17 ENCOUNTER — Ambulatory Visit (HOSPITAL_BASED_OUTPATIENT_CLINIC_OR_DEPARTMENT_OTHER): Payer: Self-pay | Admitting: Anesthesiology

## 2024-03-17 DIAGNOSIS — Z79899 Other long term (current) drug therapy: Secondary | ICD-10-CM | POA: Insufficient documentation

## 2024-03-17 DIAGNOSIS — F17210 Nicotine dependence, cigarettes, uncomplicated: Secondary | ICD-10-CM | POA: Insufficient documentation

## 2024-03-17 DIAGNOSIS — I10 Essential (primary) hypertension: Secondary | ICD-10-CM | POA: Insufficient documentation

## 2024-03-17 DIAGNOSIS — F419 Anxiety disorder, unspecified: Secondary | ICD-10-CM

## 2024-03-17 DIAGNOSIS — Z7982 Long term (current) use of aspirin: Secondary | ICD-10-CM | POA: Insufficient documentation

## 2024-03-17 DIAGNOSIS — E210 Primary hyperparathyroidism: Secondary | ICD-10-CM | POA: Diagnosis not present

## 2024-03-17 DIAGNOSIS — Z720 Tobacco use: Principal | ICD-10-CM

## 2024-03-17 HISTORY — PX: PARATHYROIDECTOMY: SHX19

## 2024-03-17 SURGERY — PARATHYROIDECTOMY
Anesthesia: General

## 2024-03-17 MED ORDER — DIPHENHYDRAMINE HCL 12.5 MG/5ML PO ELIX
12.5000 mg | ORAL_SOLUTION | Freq: Four times a day (QID) | ORAL | Status: DC | PRN
  Filled 2024-03-17: qty 5

## 2024-03-17 MED ORDER — OXYCODONE HCL 5 MG/5ML PO SOLN: 5.0000 mg | Freq: Once | ORAL | Status: DC | PRN

## 2024-03-17 MED ORDER — ACETAMINOPHEN 500 MG PO TABS
1000.0000 mg | ORAL_TABLET | Freq: Once | ORAL | Status: AC
  Administered 2024-03-17: 1000 mg via ORAL
  Filled 2024-03-17: qty 2

## 2024-03-17 MED ORDER — ONDANSETRON HCL 4 MG/2ML IJ SOLN
INTRAMUSCULAR | Status: AC
  Filled 2024-03-17: qty 2

## 2024-03-17 MED ORDER — OXYCODONE HCL 5 MG PO TABS: 5.0000 mg | ORAL_TABLET | Freq: Once | ORAL | Status: DC | PRN

## 2024-03-17 MED ORDER — ONDANSETRON HCL 4 MG/2ML IJ SOLN
INTRAMUSCULAR | Status: DC | PRN
  Administered 2024-03-17: 4 mg via INTRAVENOUS

## 2024-03-17 MED ORDER — ONDANSETRON HCL 4 MG/2ML IJ SOLN: 4.0000 mg | Freq: Four times a day (QID) | INTRAMUSCULAR | Status: DC | PRN

## 2024-03-17 MED ORDER — EPHEDRINE SULFATE (PRESSORS) 25 MG/5ML IV SOSY
PREFILLED_SYRINGE | INTRAVENOUS | Status: DC | PRN
  Administered 2024-03-17: 10 mg via INTRAVENOUS
  Administered 2024-03-17: 5 mg via INTRAVENOUS
  Administered 2024-03-17: 10 mg via INTRAVENOUS

## 2024-03-17 MED ORDER — PROPOFOL 10 MG/ML IV BOLUS
INTRAVENOUS | Status: AC
  Filled 2024-03-17: qty 20

## 2024-03-17 MED ORDER — LIDOCAINE 2% (20 MG/ML) 5 ML SYRINGE
INTRAMUSCULAR | Status: DC | PRN
  Administered 2024-03-17: 100 mg via INTRAVENOUS

## 2024-03-17 MED ORDER — MIDAZOLAM HCL 2 MG/2ML IJ SOLN
INTRAMUSCULAR | Status: AC
  Filled 2024-03-17: qty 2

## 2024-03-17 MED ORDER — PHENYLEPHRINE HCL (PRESSORS) 10 MG/ML IV SOLN
INTRAVENOUS | Status: DC | PRN
  Administered 2024-03-17: 160 ug via INTRAVENOUS
  Administered 2024-03-17: 80 ug via INTRAVENOUS

## 2024-03-17 MED ORDER — BUPIVACAINE-EPINEPHRINE 0.25% -1:200000 IJ SOLN
INTRAMUSCULAR | Status: DC | PRN
  Administered 2024-03-17: 20 mL

## 2024-03-17 MED ORDER — ROCURONIUM BROMIDE 10 MG/ML (PF) SYRINGE
PREFILLED_SYRINGE | INTRAVENOUS | Status: AC
  Filled 2024-03-17: qty 10

## 2024-03-17 MED ORDER — SUGAMMADEX SODIUM 200 MG/2ML IV SOLN
INTRAVENOUS | Status: DC | PRN
  Administered 2024-03-17: 200 mg via INTRAVENOUS

## 2024-03-17 MED ORDER — HYDROCHLOROTHIAZIDE 25 MG PO TABS
25.0000 mg | ORAL_TABLET | Freq: Every morning | ORAL | Status: DC
  Administered 2024-03-18: 25 mg via ORAL
  Filled 2024-03-17: qty 1

## 2024-03-17 MED ORDER — PROPOFOL 10 MG/ML IV BOLUS
INTRAVENOUS | Status: DC | PRN
  Administered 2024-03-17: 150 mg via INTRAVENOUS

## 2024-03-17 MED ORDER — CHLORHEXIDINE GLUCONATE 0.12 % MT SOLN
15.0000 mL | Freq: Once | OROMUCOSAL | Status: AC
  Administered 2024-03-17: 15 mL via OROMUCOSAL

## 2024-03-17 MED ORDER — PHENYLEPHRINE HCL-NACL 20-0.9 MG/250ML-% IV SOLN
INTRAVENOUS | Status: AC
  Filled 2024-03-17: qty 500

## 2024-03-17 MED ORDER — PANTOPRAZOLE SODIUM 40 MG PO TBEC
40.0000 mg | DELAYED_RELEASE_TABLET | Freq: Every day | ORAL | Status: DC
  Administered 2024-03-18: 40 mg via ORAL
  Filled 2024-03-17: qty 1

## 2024-03-17 MED ORDER — OXYCODONE HCL 5 MG PO TABS: 5.0000 mg | ORAL_TABLET | Freq: Four times a day (QID) | ORAL | Status: DC | PRN

## 2024-03-17 MED ORDER — ACETAMINOPHEN 500 MG PO TABS: 1000.0000 mg | ORAL_TABLET | ORAL | Status: AC

## 2024-03-17 MED ORDER — SUGAMMADEX SODIUM 200 MG/2ML IV SOLN
INTRAVENOUS | Status: AC
  Filled 2024-03-17: qty 2

## 2024-03-17 MED ORDER — DEXAMETHASONE SOD PHOSPHATE PF 10 MG/ML IJ SOLN
INTRAMUSCULAR | Status: DC | PRN
  Administered 2024-03-17: 10 mg via INTRAVENOUS

## 2024-03-17 MED ORDER — DIPHENHYDRAMINE HCL 50 MG/ML IJ SOLN: 12.5000 mg | Freq: Four times a day (QID) | INTRAMUSCULAR | Status: DC | PRN

## 2024-03-17 MED ORDER — LEVOTHYROXINE SODIUM 100 MCG PO TABS
100.0000 ug | ORAL_TABLET | Freq: Every day | ORAL | 1 refills | Status: AC
  Filled 2024-03-17: qty 90, 90d supply, fill #0

## 2024-03-17 MED ORDER — PHENYLEPHRINE HCL-NACL 20-0.9 MG/250ML-% IV SOLN
INTRAVENOUS | Status: DC | PRN
  Administered 2024-03-17: 50 ug/min via INTRAVENOUS

## 2024-03-17 MED ORDER — CHLORHEXIDINE GLUCONATE CLOTH 2 % EX PADS: 6.0000 | MEDICATED_PAD | Freq: Once | CUTANEOUS | Status: DC

## 2024-03-17 MED ORDER — FUROSEMIDE 20 MG PO TABS: 20.0000 mg | ORAL_TABLET | Freq: Every day | ORAL | Status: DC | PRN

## 2024-03-17 MED ORDER — ORAL CARE MOUTH RINSE: 15.0000 mL | Freq: Once | OROMUCOSAL | Status: AC

## 2024-03-17 MED ORDER — 0.9 % SODIUM CHLORIDE (POUR BTL) OPTIME
TOPICAL | Status: DC | PRN
  Administered 2024-03-17: 1000 mL

## 2024-03-17 MED ORDER — FENTANYL CITRATE (PF) 50 MCG/ML IJ SOSY: 25.0000 ug | PREFILLED_SYRINGE | INTRAMUSCULAR | Status: DC | PRN

## 2024-03-17 MED ORDER — DEXMEDETOMIDINE HCL IN NACL 80 MCG/20ML IV SOLN
INTRAVENOUS | Status: DC | PRN
  Administered 2024-03-17: 10 ug via INTRAVENOUS

## 2024-03-17 MED ORDER — ENSURE PRE-SURGERY PO LIQD
296.0000 mL | Freq: Once | ORAL | Status: DC
  Filled 2024-03-17: qty 296

## 2024-03-17 MED ORDER — CELECOXIB 200 MG PO CAPS
400.0000 mg | ORAL_CAPSULE | ORAL | Status: AC
  Administered 2024-03-17: 400 mg via ORAL
  Filled 2024-03-17: qty 2

## 2024-03-17 MED ORDER — FENTANYL CITRATE (PF) 250 MCG/5ML IJ SOLN
INTRAMUSCULAR | Status: AC
  Filled 2024-03-17: qty 5

## 2024-03-17 MED ORDER — STERILE WATER FOR IRRIGATION IR SOLN
Status: DC | PRN
  Administered 2024-03-17: 1000 mL

## 2024-03-17 MED ORDER — AMISULPRIDE (ANTIEMETIC) 5 MG/2ML IV SOLN: 10.0000 mg | Freq: Once | INTRAVENOUS | Status: DC | PRN

## 2024-03-17 MED ORDER — TRAMADOL HCL 50 MG PO TABS
50.0000 mg | ORAL_TABLET | Freq: Four times a day (QID) | ORAL | Status: DC | PRN
  Administered 2024-03-17: 50 mg via ORAL
  Filled 2024-03-17: qty 1

## 2024-03-17 MED ORDER — ROCURONIUM 10MG/ML (10ML) SYRINGE FOR MEDFUSION PUMP - OPTIME
INTRAVENOUS | Status: DC | PRN
  Administered 2024-03-17: 50 mg via INTRAVENOUS
  Administered 2024-03-17: 30 mg via INTRAVENOUS

## 2024-03-17 MED ORDER — ACETAMINOPHEN 500 MG PO TABS
1000.0000 mg | ORAL_TABLET | Freq: Four times a day (QID) | ORAL | Status: DC
  Administered 2024-03-17 – 2024-03-18 (×3): 1000 mg via ORAL
  Filled 2024-03-17 (×4): qty 2

## 2024-03-17 MED ORDER — CELECOXIB 200 MG PO CAPS
200.0000 mg | ORAL_CAPSULE | Freq: Two times a day (BID) | ORAL | Status: DC
  Administered 2024-03-17 (×2): 200 mg via ORAL
  Filled 2024-03-17 (×2): qty 1

## 2024-03-17 MED ORDER — LACTATED RINGERS IV SOLN: INTRAVENOUS | Status: DC

## 2024-03-17 MED ORDER — FENTANYL CITRATE (PF) 250 MCG/5ML IJ SOLN
INTRAMUSCULAR | Status: DC | PRN
  Administered 2024-03-17 (×2): 50 ug via INTRAVENOUS
  Administered 2024-03-17: 100 ug via INTRAVENOUS
  Administered 2024-03-17: 50 ug via INTRAVENOUS

## 2024-03-17 MED ORDER — CEFAZOLIN SODIUM-DEXTROSE 2-4 GM/100ML-% IV SOLN
2.0000 g | INTRAVENOUS | Status: AC
  Administered 2024-03-17: 2 g via INTRAVENOUS
  Filled 2024-03-17: qty 100

## 2024-03-17 MED ORDER — LEVOTHYROXINE SODIUM 100 MCG PO TABS
100.0000 ug | ORAL_TABLET | Freq: Every day | ORAL | Status: DC
  Administered 2024-03-18: 100 ug via ORAL
  Filled 2024-03-17: qty 1

## 2024-03-17 MED ORDER — TRAMADOL HCL 50 MG PO TABS
50.0000 mg | ORAL_TABLET | Freq: Four times a day (QID) | ORAL | 0 refills | Status: AC | PRN
  Filled 2024-03-17: qty 7, 2d supply, fill #0

## 2024-03-17 MED ORDER — MIDAZOLAM HCL (PF) 2 MG/2ML IJ SOLN
INTRAMUSCULAR | Status: DC | PRN
  Administered 2024-03-17: 2 mg via INTRAVENOUS

## 2024-03-17 MED ORDER — OXIDIZED CELLULOSE EX PADS
MEDICATED_PAD | CUTANEOUS | Status: DC | PRN
  Administered 2024-03-17: 1 via TOPICAL

## 2024-03-17 MED ORDER — HYDROMORPHONE HCL 1 MG/ML IJ SOLN: 0.5000 mg | INTRAMUSCULAR | Status: DC | PRN

## 2024-03-17 MED ORDER — METOPROLOL TARTRATE 5 MG/5ML IV SOLN: 5.0000 mg | Freq: Four times a day (QID) | INTRAVENOUS | Status: DC | PRN

## 2024-03-17 MED ORDER — LIDOCAINE HCL (PF) 2 % IJ SOLN
INTRAMUSCULAR | Status: AC
  Filled 2024-03-17: qty 5

## 2024-03-17 MED ORDER — ONDANSETRON 4 MG PO TBDP: 4.0000 mg | ORAL_TABLET | Freq: Four times a day (QID) | ORAL | Status: DC | PRN

## 2024-03-17 MED ORDER — BUPIVACAINE-EPINEPHRINE (PF) 0.25% -1:200000 IJ SOLN
INTRAMUSCULAR | Status: AC
  Filled 2024-03-17: qty 30

## 2024-03-17 SURGICAL SUPPLY — 33 items
ATTRACTOMAT 16X20 MAGNETIC DRP (DRAPES) ×1 IMPLANT
BLADE SURG 15 STRL LF DISP TIS (BLADE) ×1 IMPLANT
CHLORAPREP W/TINT 26 (MISCELLANEOUS) ×1 IMPLANT
CLIP TI MEDIUM 6 (CLIP) ×1 IMPLANT
CLIP TI WIDE RED SMALL 6 (CLIP) ×1 IMPLANT
COVER SURGICAL LIGHT HANDLE (MISCELLANEOUS) ×1 IMPLANT
DERMABOND ADVANCED .7 DNX12 (GAUZE/BANDAGES/DRESSINGS) ×1 IMPLANT
DISSECTOR SURG LIGASURE 21 (MISCELLANEOUS) ×1 IMPLANT
DRAPE LAPAROTOMY T 98X78 PEDS (DRAPES) ×1 IMPLANT
DRAPE UTILITY XL STRL (DRAPES) ×1 IMPLANT
ELECT PENCIL ROCKER SW 15FT (MISCELLANEOUS) ×1 IMPLANT
ELECT REM PT RETURN 15FT ADLT (MISCELLANEOUS) ×1 IMPLANT
GAUZE 4X4 16PLY ~~LOC~~+RFID DBL (SPONGE) ×1 IMPLANT
GLOVE BIOGEL PI IND STRL 7.0 (GLOVE) ×1 IMPLANT
GLOVE SURG SS PI 7.0 STRL IVOR (GLOVE) ×1 IMPLANT
GOWN STRL REUS W/ TWL LRG LVL3 (GOWN DISPOSABLE) ×1 IMPLANT
GOWN STRL REUS W/ TWL XL LVL3 (GOWN DISPOSABLE) IMPLANT
HEMOSTAT SURGICEL 2X4 FIBR (HEMOSTASIS) ×1 IMPLANT
ILLUMINATOR WAVEGUIDE N/F (MISCELLANEOUS) ×1 IMPLANT
KIT BASIN OR (CUSTOM PROCEDURE TRAY) ×1 IMPLANT
KIT TURNOVER KIT A (KITS) ×1 IMPLANT
NDL HYPO 25X1 1.5 SAFETY (NEEDLE) ×1 IMPLANT
NEEDLE HYPO 25X1 1.5 SAFETY (NEEDLE) ×1 IMPLANT
PACK BASIC VI WITH GOWN DISP (CUSTOM PROCEDURE TRAY) ×1 IMPLANT
STAPLER SKIN PROX 35W (STAPLE) IMPLANT
SUT MNCRL AB 4-0 PS2 18 (SUTURE) ×1 IMPLANT
SUT SILK 2-0 18XBRD TIE 12 (SUTURE) IMPLANT
SUT SILK 3-0 18XBRD TIE 12 (SUTURE) IMPLANT
SUT VIC AB 3-0 SH 18 (SUTURE) ×1 IMPLANT
SYR BULB IRRIG 60ML STRL (SYRINGE) ×1 IMPLANT
SYR CONTROL 10ML LL (SYRINGE) ×1 IMPLANT
TOWEL OR 17X26 10 PK STRL BLUE (TOWEL DISPOSABLE) ×1 IMPLANT
TUBING CONNECTING 10 (TUBING) ×1 IMPLANT

## 2024-03-17 NOTE — Anesthesia Procedure Notes (Signed)
 Procedure Name: Intubation Date/Time: 03/17/2024 7:47 AM  Performed by: Nanci Riis, CRNAPre-anesthesia Checklist: Patient identified, Emergency Drugs available, Suction available, Patient being monitored and Timeout performed Patient Re-evaluated:Patient Re-evaluated prior to induction Oxygen Delivery Method: Circle system utilized Preoxygenation: Pre-oxygenation with 100% oxygen Induction Type: IV induction Ventilation: Mask ventilation without difficulty Laryngoscope Size: Miller and 3 Grade View: Grade I Tube type: Oral Tube size: 7.0 mm Number of attempts: 1 Airway Equipment and Method: Stylet Placement Confirmation: ETT inserted through vocal cords under direct vision, positive ETCO2 and breath sounds checked- equal and bilateral Secured at: 21 cm Tube secured with: Tape Dental Injury: Teeth and Oropharynx as per pre-operative assessment

## 2024-03-17 NOTE — H&P (Signed)
 " Chief Complaint  Patient presents with  Hyperparathyroidism   Subjective   Chloe Warren is a 58 y.o. female new patient in today for: History of Present Illness Chloe Warren is a 58 year old female with hypercalcemia and parathyroid  hormone elevation who presents for evaluation of parathyroid  disease.  She has elevated calcium levels without kidney stones. She experiences severe abdominal pain every three years, requiring hospitalization for pancreatitis, treated with IV fluids and a GI cocktail. She does not consume alcohol and smokes half a pack of cigarettes per day. Her parathyroid  hormone levels are high, though she recalls being told they were low at one point. She has not experienced constipation, broken bones, or significant joint pain. She underwent neck surgery in the past, with high calcium levels noted before the surgery.  Social Drivers of Health with Concerns   Tobacco Use: High Risk (12/24/2023)  Patient History  Smoking Tobacco Use: Every Day  Smokeless Tobacco Use: Unknown  Housing Stability: Unknown (12/24/2023)  Housing Stability Vital Sign  Unable to Pay for Housing in the Last Year: No  Homeless in the Last Year: No   11/05/2022  [employer] Authorization for Release of Information - [employer]   Are any of your needs urgent? No  Would you like help with any of the needs that you have identified? No   Data saved with a previous flowsheet row definition   Outpatient Medications Prior to Visit  Medication Sig Dispense Refill  losartan (COZAAR) 100 MG tablet Take 100 mg by mouth once daily  omeprazole  (PRILOSEC) 40 MG DR capsule Take 40 mg by mouth once daily  acetaminophen (TYLENOL) 500 MG tablet Take 1,000 mg by mouth every 6 (six) hours as needed  albuterol MDI, PROVENTIL, VENTOLIN, PROAIR, HFA 90 mcg/actuation inhaler Inhale 2 inhalations into the lungs every 6 (six) hours as needed  codeine-guaiFENesin 10-100 mg/5 mL oral liquid 10 mLs every 6 (six) hours as  needed  metoprolol TARTrate (LOPRESSOR) 50 MG tablet Take 50 mg by mouth every evening  multivitamin with minerals tablet Take 1 tablet by mouth once daily  triamcinolone 0.1 % cream Apply topically 2 (two) times daily APPLY TO AFFECTED AREA   No facility-administered medications prior to visit.    Objective   Vitals:  12/24/23 1505  BP: (!) 140/88  Pulse: 84  Temp: 36.6 C (97.9 F)  SpO2: 96%  Weight: (!) 115.3 kg (254 lb 3.2 oz)  Height: 162.6 cm (5' 4)  PainSc: 0-No pain   Body mass index is 43.63 kg/m. Physical Exam Constitutional:  Appearance: Normal appearance.  HENT:  Head: Normocephalic and atraumatic.  Neck:  Comments: Left sided oblique scar, no palpable masses Pulmonary:  Effort: Pulmonary effort is normal.  Musculoskeletal:  General: Normal range of motion.  Cervical back: Normal range of motion.  Neurological:  General: No focal deficit present.  Mental Status: She is alert and oriented to person, place, and time. Mental status is at baseline.  Psychiatric:  Mood and Affect: Mood normal.  Behavior: Behavior normal.  Thought Content: Thought content normal.    I reviewed I reviewed notes by Rankin Dike. She has had a long history of elevated calcium 10.6 this year, and this year had confirmation with elevated PTH of 75  Assessment/Plan:   Assessment & Plan Primary hyperparathyroidism Chronic primary hyperparathyroidism with elevated calcium and parathyroid  hormone levels. Risk for osteoporosis and kidney stones due to increased calcium release from bones and renal excretion.  - Educated about  surgical procedure, including risks of general anesthesia, potential impact on voice due to proximity to vocal cord nerves, and recovery expectations. - Advised on potential need for vitamin D supplementation to aid calcium absorption.  Diagnoses and all orders for this visit:  Hyperparathyroidism (CMS/HHS-HCC) CT and NM localized to right inferior  parathyroid  OR for parathyroidectomy, focusing on R lower area  "

## 2024-03-17 NOTE — Transfer of Care (Signed)
 Immediate Anesthesia Transfer of Care Note  Patient: Chloe Warren  Procedure(s) Performed: PARATHYROIDECTOMY  Patient Location: PACU  Anesthesia Type:General  Level of Consciousness: drowsy and patient cooperative  Airway & Oxygen Therapy: Patient Spontanous Breathing  Post-op Assessment: Report given to RN and Post -op Vital signs reviewed and stable  Post vital signs: Reviewed and stable  Last Vitals:  Vitals Value Taken Time  BP 146/76 03/17/24 09:51  Temp    Pulse 81 03/17/24 09:53  Resp 14 03/17/24 09:53  SpO2 88 % 03/17/24 09:53  Vitals shown include unfiled device data.  Last Pain:  Vitals:   03/17/24 0603  TempSrc:   PainSc: 0-No pain      Patients Stated Pain Goal: 4 (03/17/24 0554)  Complications: No notable events documented.

## 2024-03-17 NOTE — Anesthesia Postprocedure Evaluation (Signed)
"   Anesthesia Post Note  Patient: Chloe Warren  Procedure(s) Performed: PARATHYROIDECTOMY     Patient location during evaluation: PACU Anesthesia Type: General Level of consciousness: awake and alert Pain management: pain level controlled Vital Signs Assessment: post-procedure vital signs reviewed and stable Respiratory status: spontaneous breathing, nonlabored ventilation, respiratory function stable and patient connected to nasal cannula oxygen Cardiovascular status: blood pressure returned to baseline and stable Postop Assessment: no apparent nausea or vomiting Anesthetic complications: no   No notable events documented.  Last Vitals:  Vitals:   03/17/24 1100 03/17/24 1122  BP: (!) 151/76 (!) 151/96  Pulse: 81 88  Resp: 17 18  Temp:  36.7 C  SpO2: 91% 95%    Last Pain:  Vitals:   03/17/24 1122  TempSrc: Oral  PainSc: 4                  Chloe Warren Deborah Dondero      "

## 2024-03-17 NOTE — Op Note (Signed)
 Preoperative diagnosis: primary hyperparathyroidism  Postoperative diagnosis: same   Procedure: right superior parathyroidectomy Right thyroid lobectomy  Surgeon: Herlene Bureau, M.D.  Asst: Krystal Spinner, M.D.  Anesthesia: GETA  Indications for procedure: Chloe Warren is a 58 y.o. year old female with symptoms of hypercalcemia with localization studies to the right side.  Description of procedure: The patient was brought to the operating room and placed in a supine position on the operating room table. Following administration of general anesthesia, the patient was positioned and then prepped and draped in the usual aseptic fashion.   A small Kocher incision was made. Dissection was carried through subcutaneous tissues and platysma. Hemostasis was achieved with the electrocautery. Skin flaps were elevated cephalad and caudad from the thyroid notch to the sternal notch. A Weitlaner retractor was placed for exposure. Strap muscles were incised in the midline and dissection was begun on the right side.  Upon examination of the right posterior neck area there is initially what appeared to be an 8 mm reddish mass.  This was dissected free with LigaSure device and removed and sent for frozen.  Pathology came back as thyroid tissue.  Further dissection of the right inferior area showed moderate amount of fatty tissue with no definitive mass.  Decision was made to fully mobilize the thyroid taking the inferior artery and then mobilizing superior pole as well taking the superior artery and middle thyroid vessels.  Mobilizing showed what appeared to be a normal superior parathyroid  gland this was dissected free and removed and sent for frozen.  Pathology confirmed a normal parathyroid  gland.  During frozen section weight, imaging scans were reviewed again to help further localize potential mass.    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  Therefore decision was made to perform a thyroid right thyroid lobectomy.  Additional dissection along the tubercle was performed.  The recurrent laryngeal nerve was identified and left alone.  The entire right thyroid and isthmus were free, the isthmus was divided with LigaSure device.  Suture was placed in the superior lobe to mark.  Thyroid was sent to pathology.  With the thyroid removed additional dissection was performed to identify any additional parathyroid  like mass.  There were no additional masses identified.  Carotid sheath was inspected and opened. Dissection was continued down to the prevertebral fascia.  Snow surgicel was placed into the dissection plane. The strap muscles were re-apposed with interrupted 3-0 vicryl. The platysma was re-approximated with interrupted 3-0 vicryl. The skin was closed with 4-0 monocryl in running fashion. Dermabond was placed for dressing. The patient woke up and was taken to PACU in stable condition.  Findings: 1 normal parathyroid  gland, multinodular thyroid, initial concern for adenoma returning as normal thyroid tissue  Specimen: Right inferior neck mass, right neck mass #2, right thyroid lobe stitch marks superior  Implant: Fibrillar  Blood loss: 30 ml  Local anesthesia: 20 ml marcaine    Complications: Unable to identify a parathyroid  adenoma, right thyroidectomy performed due to multinodular type thyroid and concern for possible adenoma within the thyroid.  Herlene Bureau, M.D. General, Bariatric, & Minimally Invasive Surgery [facility]

## 2024-03-18 ENCOUNTER — Encounter (HOSPITAL_COMMUNITY): Payer: Self-pay | Admitting: General Surgery

## 2024-03-18 ENCOUNTER — Other Ambulatory Visit (HOSPITAL_COMMUNITY): Payer: Self-pay

## 2024-03-18 DIAGNOSIS — E210 Primary hyperparathyroidism: Secondary | ICD-10-CM | POA: Diagnosis not present

## 2024-03-18 LAB — BASIC METABOLIC PANEL WITH GFR
Anion gap: 12 (ref 5–15)
BUN: 12 mg/dL (ref 6–20)
CO2: 24 mmol/L (ref 22–32)
Calcium: 9.7 mg/dL (ref 8.9–10.3)
Chloride: 103 mmol/L (ref 98–111)
Creatinine, Ser: 0.64 mg/dL (ref 0.44–1.00)
GFR, Estimated: >60 mL/min
Glucose, Bld: 124 mg/dL — ABNORMAL HIGH (ref 70–99)
Potassium: 4.1 mmol/L (ref 3.5–5.1)
Sodium: 139 mmol/L (ref 135–145)

## 2024-03-18 NOTE — Discharge Instructions (Addendum)
 PARATHYROID  SURGERY: POST OP INSTRUCTIONS  ######################################################################  EAT Gradually transition to a high fiber diet with a fiber supplement over the next few weeks after discharge.  Start with a pureed / full liquid diet (see below)  WALK Walk an hour a day.  Control your pain to do that.    CONTROL PAIN Control pain so that you can walk, sleep, tolerate sneezing/coughing, go up/down stairs.  HAVE A BOWEL MOVEMENT DAILY Keep your bowels regular to avoid problems.  OK to try a laxative to override constipation.  OK to use an antidairrheal to slow down diarrhea.  Call if not better after 2 tries  CALL IF YOU HAVE PROBLEMS/CONCERNS Call if you are still struggling despite following these instructions. Call if you have concerns not answered by these instructions  ######################################################################    DIET: Follow a light bland diet & liquids the first 24 hours after arrival home, such as soup, liquids, starches, etc.  Be sure to drink plenty of fluids.  Quickly advance to a usual solid diet within a few days.  Avoid fast food or heavy meals as your are more likely to get nauseated or have irregular bowels.  A low-fat, high-fiber diet for the rest of your life is ideal.    Take your usually prescribed home medications unless otherwise directed.  Start levothyroxine  thyroid medication for now.  Dr. Stevie will determine if you need to continue or come off that on your postop visit.  PAIN CONTROL: Pain is best controlled by a usual combination of three different methods TOGETHER: Ice/Heat Over the counter pain medication Prescription pain medication Most patients will experience some swelling and bruising around the incisions.  Ice packs or heating pads (30-60 minutes up to 6 times a day) will help. Use ice for the first few days to help decrease swelling and bruising, then switch to heat to help relax  tight/sore spots and speed recovery.  Some people prefer to use ice alone, heat alone, alternating between ice & heat.  Experiment to what works for you.  Swelling and bruising can take several weeks to resolve.   It is helpful to take an over-the-counter pain medication regularly for the first few weeks.  Choose one of the following that works best for you: Naproxen (Aleve, etc)  Two 220mg  tabs twice a day Ibuprofen (Advil, etc) Three 200mg  tabs four times a day (every meal & bedtime) Acetaminophen  (Tylenol , etc) 500-650mg  four times a day (every meal & bedtime) A  prescription for pain medication (such as oxycodone , hydrocodone, etc) should be given to you upon discharge.  Take your pain medication as prescribed.  If you are having problems/concerns with the prescription medicine (does not control pain, nausea, vomiting, rash, itching, etc), please call us  [phone] to see if we need to switch you to a different pain medicine that will work better for you and/or control your side effect better. If you need a refill on your pain medication, please contact your pharmacy.  They will contact our office to request authorization. Prescriptions will not be filled after 5 pm or on week-ends. Avoid getting constipated.  Between the surgery and the pain medications, it is common to experience some constipation.  Increasing fluid intake and taking a fiber supplement (such as Metamucil, Citrucel, FiberCon, MiraLax, etc) 1-2 times a day regularly will usually help prevent this problem from occurring.  A mild laxative (prune juice, Milk of Magnesia, MiraLax, etc) should be taken according to package directions if there are  no bowel movements after 48 hours.    Wash / shower every day.  You may shower over the dressings & any skin tapes as they are waterproof.  Continue to shower over incision(s) after the dressing is off.   ACTIVITIES as tolerated:   You may resume regular (light) daily activities beginning  the next day--such as daily self-care, walking, climbing stairs--gradually increasing activities as tolerated.  If you can walk 30 minutes without difficulty, it is safe to try more intense activity such as jogging, treadmill, bicycling, low-impact aerobics, swimming, etc. Save the most intensive and strenuous activity for last such as sit-ups, heavy lifting, contact sports, etc  Refrain from any heavy lifting or straining until you are off narcotics for pain control.   DO NOT PUSH THROUGH PAIN.  Let pain be your guide: If it hurts to do something, don't do it.  Pain is your body warning you to avoid that activity for another week until the pain goes down. You may drive when you are no longer taking prescription pain medication, you can comfortably wear a seatbelt, and you can safely maneuver your car and apply brakes. You may have sexual intercourse when it is comfortable.   FOLLOW UP in our office 04/13/2024  9. IF YOU HAVE DISABILITY OR FAMILY LEAVE FORMS, BRING THEM TO THE OFFICE FOR PROCESSING.  DO NOT GIVE THEM TO YOUR DOCTOR.   WHEN TO CALL US  [phone]: Poor pain control Reactions / problems with new medications (rash/itching, nausea, etc)  Fever over 101.5 F (38.5 C) Worsening swelling or bruising Continued bleeding from incision. Increased pain, redness, or drainage from the incision Difficulty breathing / swallowing   The clinic staff is available to answer your questions during regular business hours (8:30am-5pm).  Please dont hesitate to call and ask to speak to one of our nurses for clinical concerns.   If you have a medical emergency, go to the nearest emergency room or call 911.  A surgeon from [facility] is always on call at the [facility], [place] [address], [place], [place]  [address] ? MAIN: [phone] ? TOLL FREE: [phone] ?  FAX [phone] [email or web address]

## 2024-03-18 NOTE — Discharge Summary (Signed)
 Physician Discharge Summary    Chloe Warren MRN: [id number] DOB/AGE: [birth year] = 58 y.o.  Patient Care Team: Practice, [facility] Family as PCP - General Kinsinger, Herlene Righter, MD as Consulting Physician (General Surgery) Montey Lot, PA-C as Physician Assistant (Family Medicine)  Admit date: 03/17/2024  Discharge date: 03/18/2024  Hospital Stay = 0 days    Discharge Diagnoses:  Principal Problem:   Primary hyperparathyroidism   1 Day Post-Op  03/17/2024  Preoperative diagnosis: primary hyperparathyroidism   Postoperative diagnosis: same    Procedure:  Right superior parathyroidectomy Right thyroid lobectomy   Surgeon: Herlene Bureau, M.D.  Findings: 1 normal parathyroid  gland, multinodular thyroid, initial concern for adenoma returning as normal thyroid tissue   Consults: Anesthesia  Hospital Course:   The patient underwent the surgery above.  Postoperatively, the patient gradually mobilized and advanced to a solid diet.  Pain and other symptoms were treated aggressively.    By the time of discharge, the patient was walking well the hallways, eating food, having flatus.  Calcium in the nines down from the 10's.  Pain was well-controlled on an oral medications.  Based on meeting discharge criteria and continuing to recover, I felt it was safe for the patient to be discharged from the hospital to further recover with close followup. Postoperative recommendations were discussed in detail.  They are written as well.  Discharged Condition: good  Discharge Exam: Blood pressure (!) 144/89, pulse 70, temperature 98.2 F (36.8 C), temperature source Oral, resp. rate 16, height 5' 4 (1.626 m), weight 109.8 kg, SpO2 93%.  General: Pt awake/alert/oriented x4 in No acute distress Eyes: PERRL, normal EOM.  Sclera clear.  No icterus Neuro: CN II-XII intact w/o focal sensory/motor deficits. Lymph: No head/neck/groin lymphadenopathy Psych:  No  delerium/psychosis/paranoia HENT: Normocephalic, Mucus membranes moist.  No thrush  Neck: Supple, No tracheal deviation.  Collar incision with normal healing ridge.  No hematoma or swelling.  No hoarseness.  Good voice strength.  Chest: No chest wall pain w good excursion CV:  Pulses intact.  Regular rhythm MS: Normal AROM mjr joints.  No obvious deformity Abdomen: Soft.  Nondistended.  Nontender.  No evidence of peritonitis.  No incarcerated hernias. Ext:  SCDs BLE.  No mjr edema.  No cyanosis Skin: No petechiae / purpura   Disposition:    Follow-up Information     Kinsinger, Herlene Righter, MD Follow up on 04/13/2024.   Specialty: General Surgery Why: To follow up after your operation Contact information: [address] [place] [address] [phone]                 Discharge disposition: 01-Home or Self Care       Discharge Instructions     Ambulatory Referral for Lung Cancer Scre   Complete by: As directed    Call MD for:   Complete by: As directed    FEVER > 101.5 F  (temperatures < 101.5 F are not significant)   Call MD for:  extreme fatigue   Complete by: As directed    Call MD for:  persistant dizziness or light-headedness   Complete by: As directed    Call MD for:  persistant nausea and vomiting   Complete by: As directed    Call MD for:  redness, tenderness, or signs of infection (pain, swelling, redness, odor or green/yellow discharge around incision site)   Complete by: As directed    Call MD for:  severe uncontrolled pain   Complete  by: As directed    Diet - low sodium heart healthy   Complete by: As directed    Start with a bland diet such as soups, liquids, starchy foods, low fat foods, etc. the first few days at home. Gradually advance to a solid, low-fat, high fiber diet by the end of the first week at home.   Add a fiber supplement to your diet (Metamucil, etc) If you feel full, bloated, or constipated, stay on a full  liquid or pureed/blenderized diet for a few days until you feel better and are no longer constipated.   Discharge instructions   Complete by: As directed    See Discharge Instructions If you are not getting better after two weeks or are noticing you are getting worse, contact our office [phone] for further advice.  We may need to adjust your medications, re-evaluate you in the office, send you to the emergency room, or see what other things we can do to help. The clinic staff is available to answer your questions during regular business hours (8:30am-5pm).  Please don't hesitate to call and ask to speak to one of our nurses for clinical concerns.    A surgeon from [facility] is always on call at the hospitals 24 hours/day If you have a medical emergency, go to the nearest emergency room or call 911.   Discharge wound care:   Complete by: As directed    It is good for closed incisions and even open wounds to be washed every day.  Shower every day.  Short baths are fine.  Wash the incisions and wounds clean with soap & water .    You may leave closed incisions open to air if it is dry.   You may cover the incision with clean gauze & replace it after your daily shower for comfort.  DERMABOND:  You have purple skin glue (Dermabond) on your incision(s).  Leave them in place, and they will fall off on their own like a scab in 2-3 weeks.  You may trim any edges that curl up with clean scissors.   Driving Restrictions   Complete by: As directed    You may drive when: - you are no longer taking narcotic prescription pain medication - you can comfortably wear a seatbelt - you can safely make sudden turns/stops without pain.   Increase activity slowly   Complete by: As directed    Start light daily activities --- self-care, walking, climbing stairs- beginning the day after surgery.  Gradually increase activities as tolerated.  Control your pain to be active.  Stop when you are tired.   Ideally, walk several times a day, eventually an hour a day.   Most people are back to most day-to-day activities in a few weeks.  It takes 4-6 weeks to get back to unrestricted, intense activity. If you can walk 30 minutes without difficulty, it is safe to try more intense activity such as jogging, treadmill, bicycling, low-impact aerobics, swimming, etc. Save the most intensive and strenuous activity for last (Usually 4-8 weeks after surgery) such as sit-ups, heavy lifting, contact sports, etc.  Refrain from any intense heavy lifting or straining until you are off narcotics for pain control.  You will have off days, but things should improve week-by-week. DO NOT PUSH THROUGH PAIN.  Let pain be your guide: If it hurts to do something, don't do it.   Lifting restrictions   Complete by: As directed    If you can  walk 30 minutes without difficulty, it is safe to try more intense activity such as jogging, treadmill, bicycling, low-impact aerobics, swimming, etc. Save the most intensive and strenuous activity for last (Usually 4-8 weeks after surgery) such as sit-ups, heavy lifting, contact sports, etc.   Refrain from any intense heavy lifting or straining until you are off narcotics for pain control.  You will have off days, but things should improve week-by-week. DO NOT PUSH THROUGH PAIN.  Let pain be your guide: If it hurts to do something, don't do it.  Pain is your body warning you to avoid that activity for another week until the pain goes down.   May shower / Bathe   Complete by: As directed    May walk up steps   Complete by: As directed    Sexual Activity Restrictions   Complete by: As directed    You may have sexual intercourse when it is comfortable. If it hurts to do something, stop.       Allergies as of 03/18/2024   No Known Allergies      Medication List     TAKE these medications    aspirin EC 81 MG tablet Take 81 mg by mouth daily. Swallow whole.   furosemide  20 MG  tablet Commonly known as: LASIX  Take 20 mg by mouth daily as needed for edema.   hydrochlorothiazide  25 MG tablet Commonly known as: HYDRODIURIL  Take 25 mg by mouth every morning.   ibuprofen 200 MG tablet Commonly known as: ADVIL Take 200-800 mg by mouth every 6 (six) hours as needed for moderate pain (pain score 4-6).   levothyroxine  100 MCG tablet Commonly known as: SYNTHROID  Take 1 tablet (100 mcg total) by mouth daily at 6 (six) AM.   NOREL AD PO Take 1 tablet by mouth at bedtime.   omeprazole  40 MG capsule Commonly known as: PRILOSEC Take 40 mg by mouth daily.   rosuvastatin 10 MG tablet Commonly known as: CRESTOR Take 10 mg by mouth daily.   traMADol  50 MG tablet Commonly known as: ULTRAM  Take 1 tablet (50 mg total) by mouth every 6 (six) hours as needed (mild pain).               Discharge Care Instructions  (From admission, onward)           Start     Ordered   03/18/24 0000  Discharge wound care:       Comments: It is good for closed incisions and even open wounds to be washed every day.  Shower every day.  Short baths are fine.  Wash the incisions and wounds clean with soap & water .    You may leave closed incisions open to air if it is dry.   You may cover the incision with clean gauze & replace it after your daily shower for comfort.  DERMABOND:  You have purple skin glue (Dermabond) on your incision(s).  Leave them in place, and they will fall off on their own like a scab in 2-3 weeks.  You may trim any edges that curl up with clean scissors.   03/18/24 0849            Significant Diagnostic Studies:  Results for orders placed or performed during the hospital encounter of 03/17/24 (from the past 72 hours)  Basic metabolic panel with GFR     Status: Abnormal   Collection Time: 03/18/24  5:13 AM  Result Value Ref Range   Sodium 139  135 - 145 mmol/L   Potassium 4.1 3.5 - 5.1 mmol/L   Chloride 103 98 - 111 mmol/L   CO2 24 22 - 32 mmol/L    Glucose, Bld 124 (H) 70 - 99 mg/dL    Comment: Glucose reference range applies only to samples taken after fasting for at least 8 hours.   BUN 12 6 - 20 mg/dL   Creatinine, Ser 9.35 0.44 - 1.00 mg/dL   Calcium 9.7 8.9 - 89.6 mg/dL   GFR, Estimated >39 >39 mL/min    Comment: (NOTE) Calculated using the CKD-EPI Creatinine Equation (2021)    Anion gap 12 5 - 15    Comment: Performed at [facility], [address] [place], [place] [address]    No results found.  Past Medical History:  Diagnosis Date   Anxiety    Arthritis    GERD (gastroesophageal reflux disease)    Hypertension     Past Surgical History:  Procedure Laterality Date   bone spurs  2022   to neck   COLONOSCOPY     PARATHYROIDECTOMY N/A 03/17/2024   Procedure: PARATHYROIDECTOMY;  Surgeon: Kinsinger, Herlene Righter, MD;  Location: [facility];  Service: General;  Laterality: N/A;    Social History   Socioeconomic History   Marital status: Married    Spouse name: Not on file   Number of children: Not on file   Years of education: Not on file   Highest education level: Not on file  Occupational History   Not on file  Tobacco Use   Smoking status: Every Day    Current packs/day: 0.50    Average packs/day: 0.5 packs/day for 46.0 years (23.0 ttl pk-yrs)    Types: Cigarettes    Start date: 1980    Passive exposure: Current   Smokeless tobacco: Never  Vaping Use   Vaping status: Never Used  Substance and Sexual Activity   Alcohol use: Not Currently   Drug use: No   Sexual activity: Not on file  Other Topics Concern   Not on file  Social History Narrative   Not on file   Social Drivers of Health   Tobacco Use: High Risk (03/17/2024)   Patient History    Smoking Tobacco Use: Every Day    Smokeless Tobacco Use: Never    Passive Exposure: Current  Financial Resource Strain: Low Risk  (11/05/2022)   Received from [facility]   Overall Financial Resource Strain (CARDIA)     Difficulty of Paying Living Expenses: Not hard at all  Food Insecurity: Patient Declined (03/17/2024)   Epic    Worried About Programme Researcher, Broadcasting/film/video in the Last Year: Patient declined    Barista in the Last Year: Patient declined  Transportation Needs: No Transportation Needs (03/17/2024)   Epic    Lack of Transportation (Medical): No    Lack of Transportation (Non-Medical): No  Physical Activity: Not on file  Stress: Not on file  Social Connections: Not on file  Intimate Partner Violence: Not At Risk (03/17/2024)   Epic    Fear of Current or Ex-Partner: No    Emotionally Abused: No    Physically Abused: No    Sexually Abused: No  Depression (PHQ2-9): Not on file  Alcohol Screen: Not on file  Housing: Low Risk (03/17/2024)   Epic    Unable to Pay for Housing in the Last Year: No    Number of Times Moved in the Last Year:  0    Homeless in the Last Year: No  Utilities: Not At Risk (03/17/2024)   Epic    Threatened with loss of utilities: No  Health Literacy: Not on file    History reviewed. No pertinent family history.  Current Facility-Administered Medications  Medication Dose Route Frequency Provider Last Rate Last Admin   acetaminophen  (TYLENOL ) tablet 1,000 mg  1,000 mg Oral Q6H Kinsinger, Herlene Righter, MD   1,000 mg at 03/18/24 9483   celecoxib  (CELEBREX ) capsule 200 mg  200 mg Oral BID Kinsinger, Herlene Righter, MD   200 mg at 03/17/24 2151   diphenhydrAMINE  (BENADRYL ) 12.5 MG/5ML elixir 12.5 mg  12.5 mg Oral Q6H PRN Kinsinger, Herlene Righter, MD       Or   diphenhydrAMINE  (BENADRYL ) injection 12.5 mg  12.5 mg Intravenous Q6H PRN Kinsinger, Herlene Righter, MD       furosemide  (LASIX ) tablet 20 mg  20 mg Oral Daily PRN Kinsinger, Herlene Righter, MD       hydrochlorothiazide  (HYDRODIURIL ) tablet 25 mg  25 mg Oral q morning Kinsinger, Herlene Righter, MD       HYDROmorphone  (DILAUDID ) injection 0.5 mg  0.5 mg Intravenous Q3H PRN Kinsinger, Herlene Righter, MD       levothyroxine  (SYNTHROID )  tablet 100 mcg  100 mcg Oral Q0600 Kinsinger, Herlene Righter, MD   100 mcg at 03/18/24 0516   metoprolol  tartrate (LOPRESSOR ) injection 5 mg  5 mg Intravenous Q6H PRN Kinsinger, Herlene Righter, MD       ondansetron  (ZOFRAN -ODT) disintegrating tablet 4 mg  4 mg Oral Q6H PRN Kinsinger, Herlene Righter, MD       Or   ondansetron  (ZOFRAN ) injection 4 mg  4 mg Intravenous Q6H PRN Kinsinger, Herlene Righter, MD       oxyCODONE  (Oxy IR/ROXICODONE ) immediate release tablet 5 mg  5 mg Oral Q6H PRN Kinsinger, Herlene Righter, MD       pantoprazole  (PROTONIX ) EC tablet 40 mg  40 mg Oral Daily Kinsinger, Herlene Righter, MD       traMADol  (ULTRAM ) tablet 50 mg  50 mg Oral Q6H PRN Kinsinger, Herlene Righter, MD   50 mg at 03/17/24 2151     Allergies[1]  Signed:   Elspeth KYM Schultze, MD, FACS, MASCRS Esophageal, Gastrointestinal & Colorectal Surgery Robotic and Minimally Invasive Surgery  [facility] [address] [place], [place] [address] [phone] Fax [phone] Main  CONTACT INFORMATION: Weekday (9AM-5PM): Call [facility] main office at [phone] Weeknight (5PM-9AM) or Weekend/Holiday: Check EPIC Web Links tab & use AMION (password  [email or web address]) for General Surgery [facility] coverage  Please, DO NOT use SecureChat  (it is not reliable communication to reach operating surgeons & will lead to a delay in care).   Epic staff messaging available for outpatient concerns needing 1-2 business day response.      03/18/2024, 8:49 AM       [1] No Known Allergies

## 2024-03-18 NOTE — Progress Notes (Signed)
 AVS reviewed with patient who verbalized an understanding. Patient to call [facility] to request a return to work note for 04/03/24. No other questions at this time. Discharge meds in a secure bag delivered to patient by this RN.

## 2024-03-20 LAB — CALCIUM, IONIZED: Calcium, Ionized, Serum: 5.5 mg/dL (ref 4.5–5.6)

## 2024-03-20 LAB — SURGICAL PATHOLOGY
